# Patient Record
Sex: Male | Born: 1960 | Race: White | Hispanic: No | Marital: Married | Smoking: Never smoker
Health system: Southern US, Community
[De-identification: ages and names within clinical notes are randomized; demographics above are authoritative.]

## PROBLEM LIST (undated history)

## (undated) DIAGNOSIS — E039 Hypothyroidism, unspecified: Secondary | ICD-10-CM

## (undated) DIAGNOSIS — K649 Unspecified hemorrhoids: Secondary | ICD-10-CM

## (undated) DIAGNOSIS — L509 Urticaria, unspecified: Secondary | ICD-10-CM

## (undated) DIAGNOSIS — K219 Gastro-esophageal reflux disease without esophagitis: Secondary | ICD-10-CM

## (undated) DIAGNOSIS — H9190 Unspecified hearing loss, unspecified ear: Secondary | ICD-10-CM

## (undated) DIAGNOSIS — T7840XA Allergy, unspecified, initial encounter: Secondary | ICD-10-CM

## (undated) DIAGNOSIS — D510 Vitamin B12 deficiency anemia due to intrinsic factor deficiency: Secondary | ICD-10-CM

## (undated) DIAGNOSIS — G5621 Lesion of ulnar nerve, right upper limb: Secondary | ICD-10-CM

## (undated) DIAGNOSIS — D696 Thrombocytopenia, unspecified: Secondary | ICD-10-CM

## (undated) DIAGNOSIS — M1990 Unspecified osteoarthritis, unspecified site: Secondary | ICD-10-CM

## (undated) DIAGNOSIS — B019 Varicella without complication: Secondary | ICD-10-CM

## (undated) HISTORY — DX: Thrombocytopenia, unspecified: D69.6

## (undated) HISTORY — PX: OTHER SURGICAL HISTORY: SHX169

## (undated) HISTORY — DX: Allergy, unspecified, initial encounter: T78.40XA

## (undated) HISTORY — DX: Gilbert syndrome: E80.4

## (undated) HISTORY — DX: Hypothyroidism, unspecified: E03.9

## (undated) HISTORY — DX: Unspecified hearing loss, unspecified ear: H91.90

## (undated) HISTORY — DX: Varicella without complication: B01.9

## (undated) HISTORY — DX: Unspecified hemorrhoids: K64.9

## (undated) HISTORY — PX: SINOSCOPY: SHX187

## (undated) HISTORY — DX: Urticaria, unspecified: L50.9

## (undated) HISTORY — DX: Lesion of ulnar nerve, right upper limb: G56.21

## (undated) HISTORY — DX: Gastro-esophageal reflux disease without esophagitis: K21.9

## (undated) HISTORY — DX: Vitamin B12 deficiency anemia due to intrinsic factor deficiency: D51.0

## (undated) HISTORY — DX: Unspecified osteoarthritis, unspecified site: M19.90

## (undated) HISTORY — PX: SEPTOPLASTY: SUR1290

---
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
## 2005-02-28 ENCOUNTER — Encounter: Admission: RE | Admit: 2005-02-28 | Discharge: 2005-02-28 | Payer: Self-pay | Admitting: *Deleted

## 2005-03-31 ENCOUNTER — Encounter: Admission: RE | Admit: 2005-03-31 | Discharge: 2005-03-31 | Payer: Self-pay | Admitting: *Deleted

## 2005-09-21 IMAGING — US US SCROTUM
1 series · 14 of 25 positions shown · non-contrast
Comparison: none

CLINICAL DATA: Palpable left scrotal mass on physical exam with superior left probable testicular appendage with loculated hydrocele and varicocele for follow up from previous scrotal ultrasound 02/28/05.  
SCROTAL ULTRASOUND: 
Comparison is made with previous scrotal ultrasound GI [REDACTED] of 02/28/05.  The bilateral testes are sonographically normal with the right measuring 4.6cm long x 2.5cm AP x 2.4cm wide and the left 4.3cm long x 2.1cm AP x 3.1cm wide with symmetrical normal testicular blood flow.  Stable findings of left varicocele are seen.  Also, partially loculated small left hydrocele is unchanged.  At the superior left scrotum outlined by loculated hydrocele is a stable 5 x 2 x 3mm soft tissue nodule with no associated abnormal vascularity--consistent with anatomic variant appendix testis.  A small 5mm spermatocele is seen at the right epididymal head with the bilateral epididymal heads remaining sonographically normal.

[Series 1: unknown · 0.09mm/px · 14 of 49 slices shown]
[im 1/49]
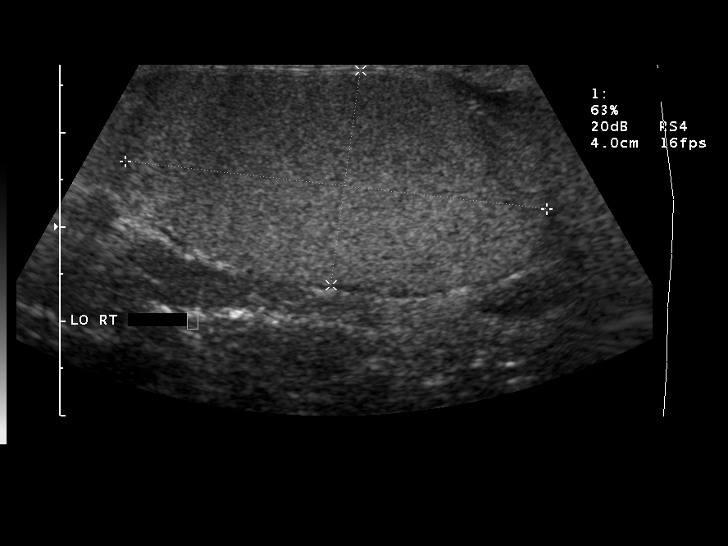
[im 5/49]
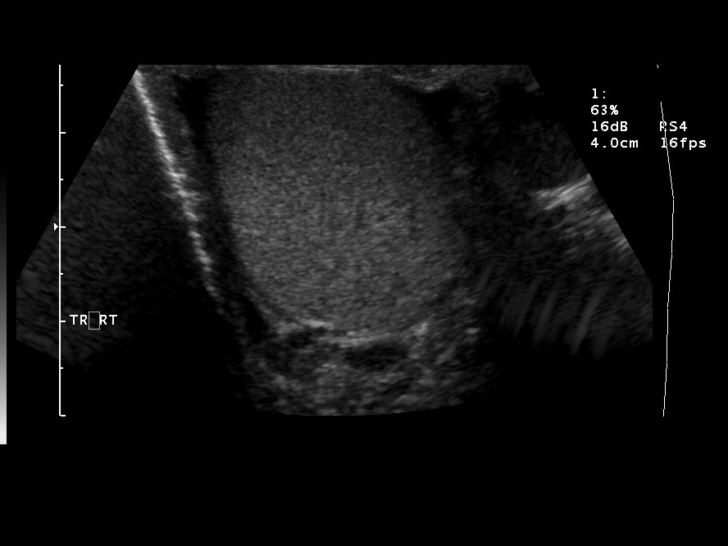
[im 9/49]
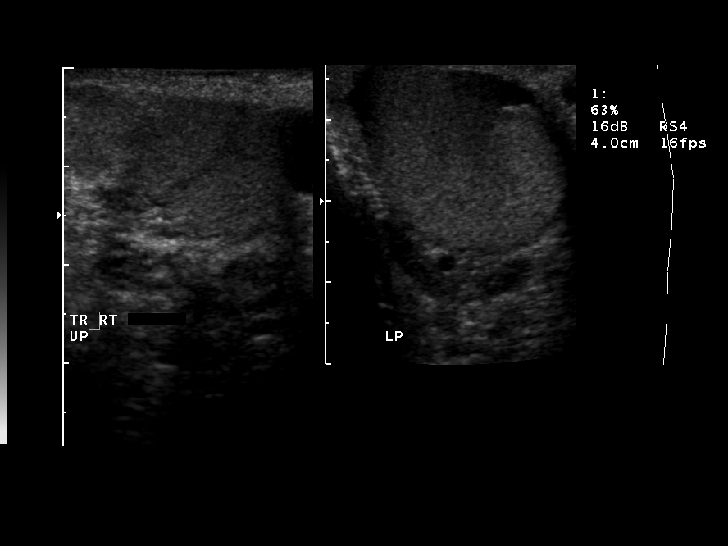
[im 13/49]
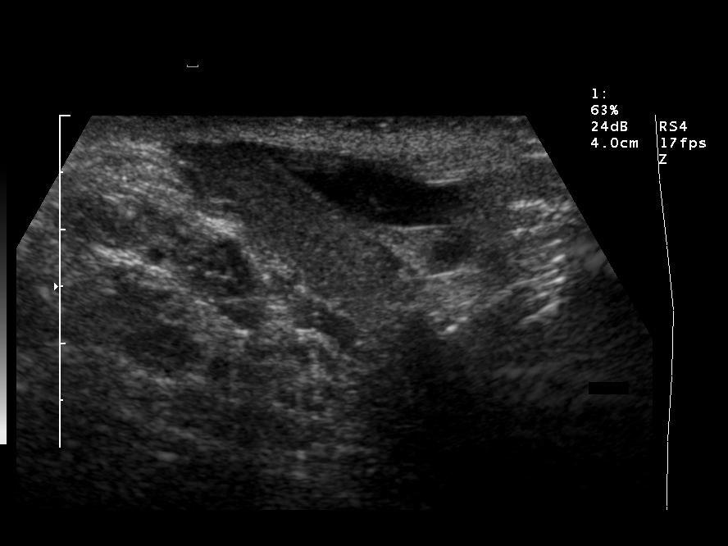
[im 17/49]
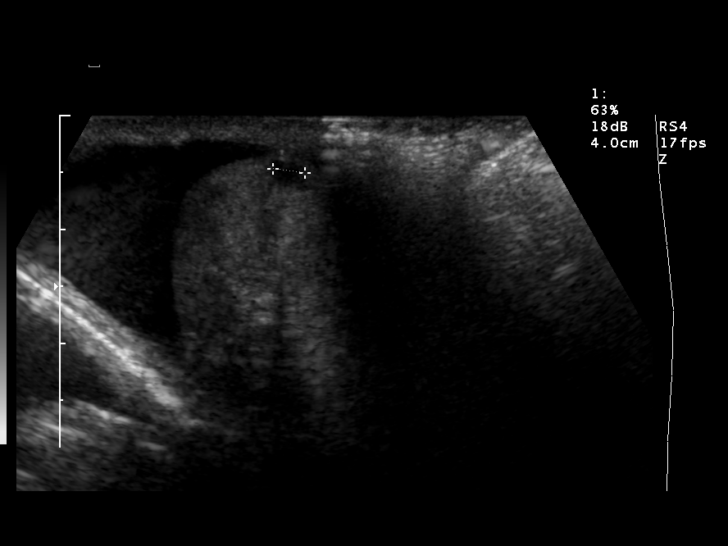
[im 19/49]
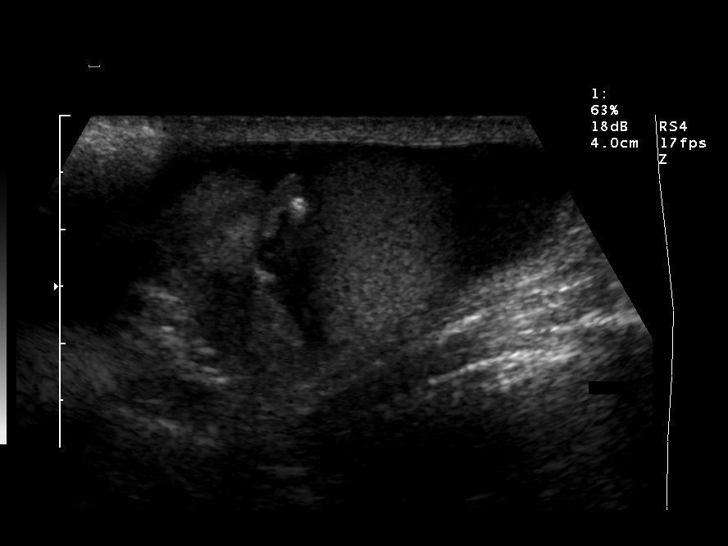
[im 23/49]
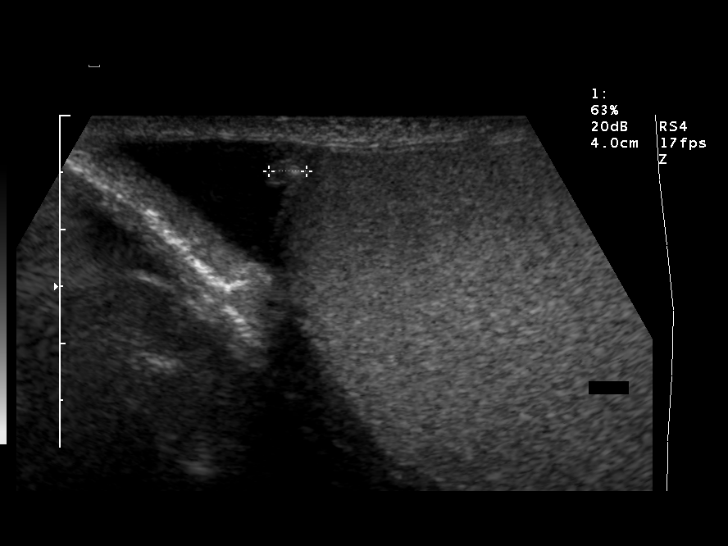
[im 27/49]
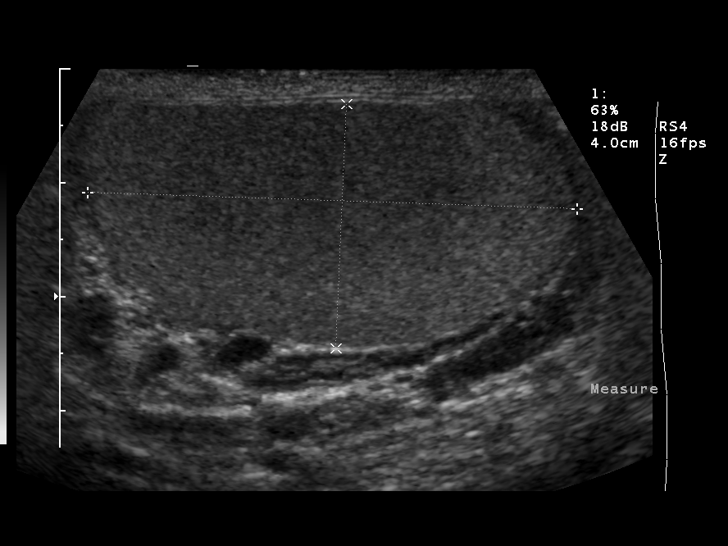
[im 31/49]
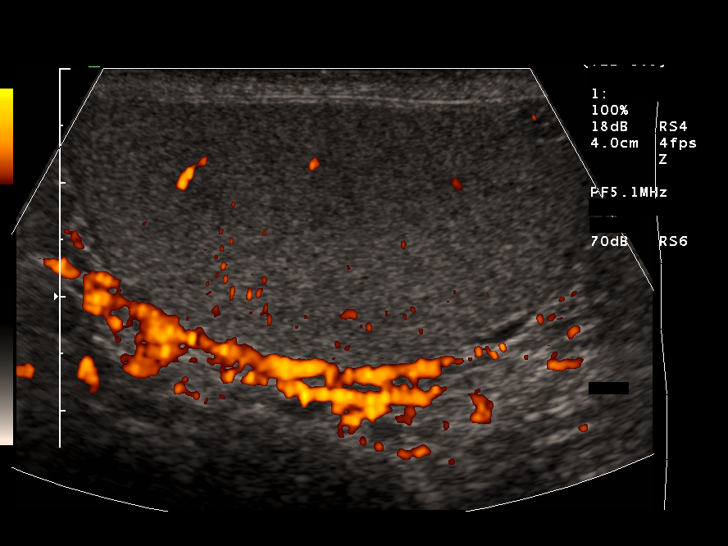
[im 33/49]
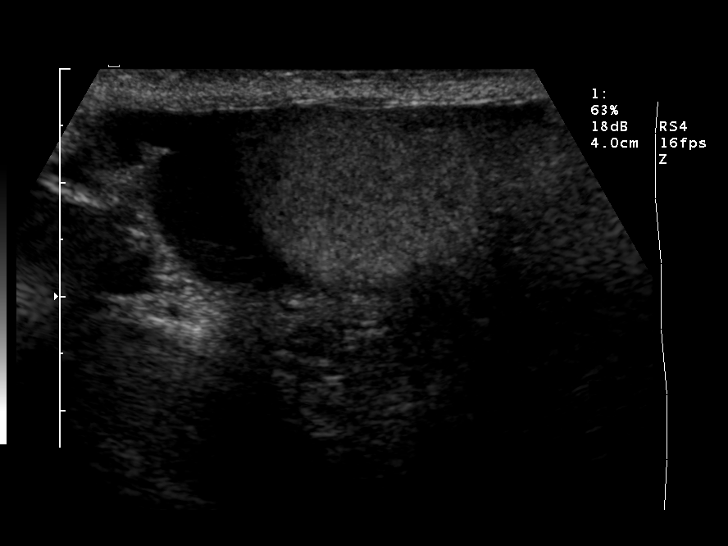
[im 37/49]
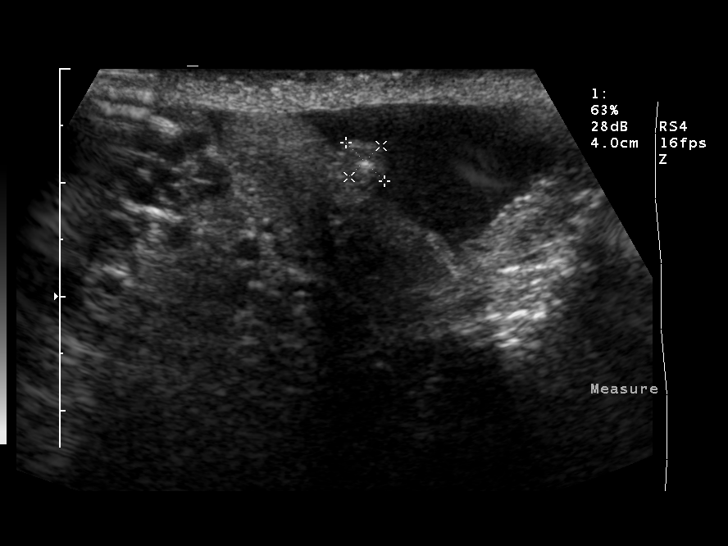
[im 41/49]
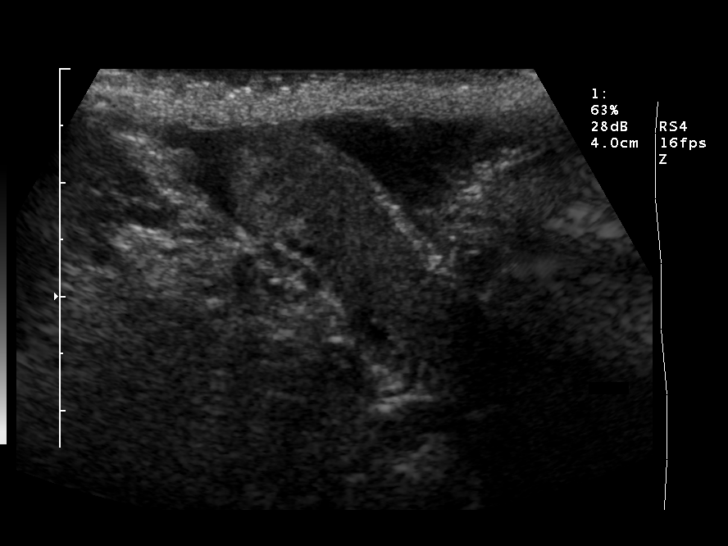
[im 45/49]
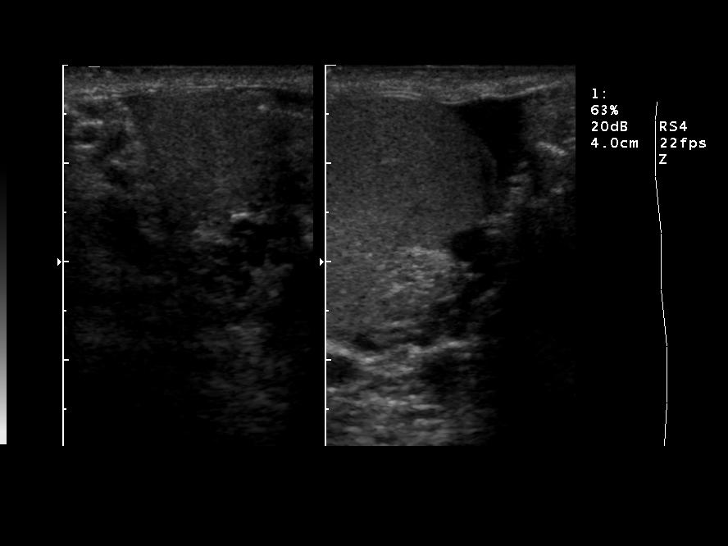
[im 49/49]
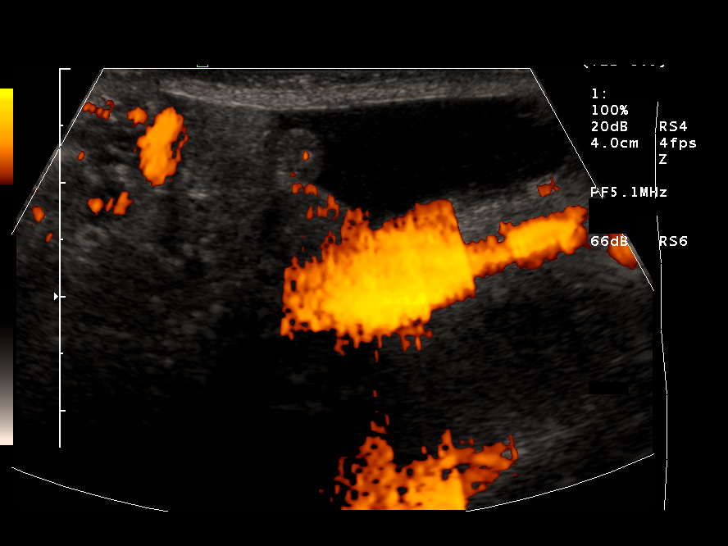

[14 of 25 positions shown; findings below may reference images not displayed]

IMPRESSION: Since previous GI [REDACTED] scrotal ultrasound 02/28/05 no interval change: 
1.  Stable slight loculated superior left hydrocele and probable anatomic left appendix testis. 
2.  Left varicocele. 
3.  Otherwise normal.

## 2008-12-07 HISTORY — PX: COLONOSCOPY: SHX174

## 2008-12-18 LAB — HM COLONOSCOPY

## 2010-01-07 ENCOUNTER — Encounter: Admission: RE | Admit: 2010-01-07 | Discharge: 2010-01-07 | Payer: Self-pay | Admitting: Family Medicine

## 2010-10-09 HISTORY — PX: SHOULDER SURGERY: SHX246

## 2010-12-20 ENCOUNTER — Other Ambulatory Visit: Payer: Self-pay | Admitting: Emergency Medicine

## 2010-12-20 ENCOUNTER — Ambulatory Visit
Admission: RE | Admit: 2010-12-20 | Discharge: 2010-12-20 | Disposition: A | Discharge status: Home / Self Care | Payer: Self-pay | Source: Ambulatory Visit | Attending: Emergency Medicine | Admitting: Emergency Medicine

## 2010-12-20 ENCOUNTER — Ambulatory Visit (INDEPENDENT_AMBULATORY_CARE_PROVIDER_SITE_OTHER): Payer: Self-pay | Admitting: Emergency Medicine

## 2010-12-20 ENCOUNTER — Encounter: Payer: Self-pay | Admitting: Emergency Medicine

## 2010-12-20 DIAGNOSIS — R52 Pain, unspecified: Secondary | ICD-10-CM

## 2010-12-20 DIAGNOSIS — M79609 Pain in unspecified limb: Secondary | ICD-10-CM | POA: Insufficient documentation

## 2010-12-20 DIAGNOSIS — E039 Hypothyroidism, unspecified: Secondary | ICD-10-CM | POA: Insufficient documentation

## 2010-12-23 ENCOUNTER — Telehealth (INDEPENDENT_AMBULATORY_CARE_PROVIDER_SITE_OTHER): Payer: Self-pay | Admitting: *Deleted

## 2010-12-27 NOTE — Assessment & Plan Note (Signed)
Summary: SWELLING TOP FOOT/TJ rm 5   Vital Signs:  Patient Profile:   50 Years Old Male CC:      LT foot pain Weight:      210.75 pounds O2 Sat:      97 % O2 treatment:    Room Air Temp:     97.9 degrees F oral Pulse rate:   70 / minute Resp:     16 per minute BP sitting:   125 / 81  (left arm) Cuff size:   large  Vitals Entered By: Clemens Catholic LPN (December 20, 2010 6:09 PM)                  Updated Prior Medication List: SYNTHROID 125 MCG TABS (LEVOTHYROXINE SODIUM)  B COMPLEX-B12  TABS (B COMPLEX VITAMINS)  FOLIC ACID 1 MG TABS (FOLIC ACID)   Current Allergies: ! PENICILLINHistory of Present Illness History from: patient Chief Complaint: LT foot pain History of Present Illness: L foot and ankle pain.  Doesn't recall any trauma or twisting ankle but has been walking more.  Also is a hiker but not too much recently. Noticed pain and swelling top or foot and ankle last few weeks.  No leg pain.  No Fever.  No immobility or reasonable causes of possible DVT.  Taking Ibuprofen daily for a recent R shoulder labral surgery. 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    REVIEW OF SYSTEMS Constitutional Symptoms      Denies fever, chills, night sweats, weight loss, weight gain, and fatigue.  Eyes       Denies change in vision, eye pain, eye discharge, glasses, contact lenses, and eye surgery. Ear/Nose/Throat/Mouth       Denies hearing loss/aids, change in hearing, ear pain, ear discharge, dizziness, frequent runny nose, frequent nose bleeds, sinus problems, sore throat, hoarseness, and tooth pain or bleeding.  Respiratory       Denies dry cough, productive cough, wheezing, shortness of breath, asthma, bronchitis, and emphysema/COPD.  Cardiovascular       Denies murmurs, chest pain, and tires easily with exhertion.    Gastrointestinal       Denies stomach pain, nausea/vomiting, diarrhea, constipation, blood in bowel movements, and  indigestion. Genitourniary       Denies painful urination, kidney stones, and loss of urinary control. Neurological       Denies paralysis, seizures, and fainting/blackouts. Musculoskeletal       Denies muscle pain, joint pain, joint stiffness, decreased range of motion, redness, swelling, muscle weakness, and gout.  Skin       Denies bruising, unusual mles/lumps or sores, and hair/skin or nail changes.  Psych       Denies mood changes, temper/anger issues, anxiety/stress, speech problems, depression, and sleep problems. Other Comments: pt states that he has had pain in his LT foot x 1wk radiating up to his ankle. he is taking IBF already due to a recent shoulder surgery.   Past History:  Past Medical History: Hypothyroidism pernicious anemia  Past Surgical History: RT shoulder surgery sinus surgery x 2  Family History: pernicious anemia  Social History: Never Smoked Alcohol use-no Drug use-no Smoking Status:  never Drug Use:  no Physical Exam General appearance: well developed, well nourished, no acute distress MSE: oriented to time, place, and person L foot/ankle: FROM, full strength, resisted motions not painful.  No TTP medial/lateral malleolus, navicular, base of 5th, calcaneus, Achilles, or proximal  fibula.  + dorsal swelling. and tenderness over tibial-talar joint and along extensor tendons.  No ecchymoses. Distal NV status intact.  No limping gait.  No calf tenderness. Assessment New Problems: FOOT PAIN (ICD-729.5) HYPOTHYROIDISM (ICD-244.9)   Plan New Orders: New Patient Level III [99203] T-DG Ankle Complete*L* [73610] T-DG Foot Complete*L* [73630] Planning Comments:   Xray of L foot & ankle are ordered.  Radiology read both as normal.  Continue ibuprofen as needed. He has 800mg  tabs at home as well as topical Voltaren.  Attempt to rest foot and ankle, ice, compression, elevation.  Will refer to Dr. Margaretha Sheffield for further sports med eval this week.  Likely  extensor tendonitis, but need to consider anterior impingement vs stress fracture.   The patient and/or caregiver has been counseled thoroughly with regard to medications prescribed including dosage, schedule, interactions, rationale for use, and possible side effects and they verbalize understanding.  Diagnoses and expected course of recovery discussed and will return if not improved as expected or if the condition worsens. Patient and/or caregiver verbalized understanding.   Orders Added: 1)  New Patient Level III [99203] 2)  T-DG Ankle Complete*L* [73610] 3)  T-DG Foot Complete*L* [91478]

## 2010-12-27 NOTE — Progress Notes (Signed)
  Phone Note Outgoing Call   Call placed by: Clemens Catholic LPN,  December 21, 2010 12:41 PM Call placed to: Patient Summary of Call: Tried to schedule appt for pt with Dr Margaretha Sheffield in [place], he is not going to be in the [place] office  this wk. pt does not want to go to the [facility] location so I tried to schedule appt with Dr Pearletha Forge. Due to pts schedule I was unable to sch appt with Dr Pearletha Forge @ a time that was good for the pt so I gave the pt Dr Lazaro Arms phone number and the pt is to call and schedule an appt when it is convient for him. Initial call taken by: Clemens Catholic LPN,  December 21, 2010 12:45 PM

## 2013-11-21 LAB — COMPREHENSIVE METABOLIC PANEL
ALBUMIN RATIO: 2.4
ALT: 19
AST: 16 U/L
Albumin: 4.5
Alkaline Phosphatase: 54 U/L
BUN/Creatinine Ratio: 14
BUN: 16
CALCIUM: 9.1 mg/dL
CO2: 26 mmol/L
Chloride: 102 mmol/L
Creatinine, Ser: 1.18
EGFR (African American): 82
GFR CALC NON AF AMER: 71
Globulin: 1.9
Glucose: 75
POTASSIUM: 4.4 mmol/L
SODIUM: 143
TOTBILIFLUID: 1.3
Total Protein: 6.4 g/dL

## 2013-11-21 LAB — PSA: PROSTATE SPECIFIC AG, SERUM: 0.5

## 2013-11-21 LAB — LIPID PANEL
Cholesterol: 148
HDL: 41
LDL: 89
TRIGLYCERIDES: 89
VLDL: 18 mg/dL

## 2013-11-28 LAB — TSH: TSH: 0.211

## 2014-02-13 LAB — TSH: TSH: 3.95

## 2014-11-13 LAB — CBC
HEMATOCRIT: 46 %
HGB: 14.9 g/dL
MCH: 28.9
MCHC: 32.4
MCV: 89.1
MPV: 9 fL (ref 0–99.8)
Platelet: 108
RBC: 5.18
RDW: 12.4
WBC: 5.3

## 2014-11-13 LAB — COMPREHENSIVE METABOLIC PANEL
ALK PHOS: 60 U/L
ALT: 25
AST: 18 U/L
BILIRUBIN: 1.5
BUN: 16
CALCIUM: 8.9 mg/dL
CO2: 29 mmol/L
CREATINE, SERUM: 1
Chloride: 105 mmol/L
GFR CALC AF AMER: 4.2
Glucose: 108
Potassium: 4.1 mmol/L
SODIUM: 144
Total Protein: 6.6 g/dL

## 2014-11-13 LAB — TSH: TSH: 0.455

## 2014-11-13 LAB — PSA: Prostate Specific Ag, Serum: 0.7

## 2014-11-20 DIAGNOSIS — F419 Anxiety disorder, unspecified: Secondary | ICD-10-CM | POA: Insufficient documentation

## 2015-03-26 ENCOUNTER — Encounter: Payer: Self-pay | Admitting: Nurse Practitioner

## 2015-03-26 ENCOUNTER — Other Ambulatory Visit (INDEPENDENT_AMBULATORY_CARE_PROVIDER_SITE_OTHER): Payer: BC Managed Care – PPO

## 2015-03-26 ENCOUNTER — Ambulatory Visit (INDEPENDENT_AMBULATORY_CARE_PROVIDER_SITE_OTHER): Payer: BC Managed Care – PPO | Admitting: Nurse Practitioner

## 2015-03-26 VITALS — BP 135/85 | HR 59 | Temp 98.4°F | Resp 16 | Ht 73.0 in | Wt 209.0 lb

## 2015-03-26 DIAGNOSIS — D510 Vitamin B12 deficiency anemia due to intrinsic factor deficiency: Secondary | ICD-10-CM | POA: Diagnosis not present

## 2015-03-26 DIAGNOSIS — E039 Hypothyroidism, unspecified: Secondary | ICD-10-CM

## 2015-03-26 DIAGNOSIS — L570 Actinic keratosis: Secondary | ICD-10-CM | POA: Insufficient documentation

## 2015-03-26 DIAGNOSIS — Z0000 Encounter for general adult medical examination without abnormal findings: Secondary | ICD-10-CM

## 2015-03-26 DIAGNOSIS — Z23 Encounter for immunization: Secondary | ICD-10-CM

## 2015-03-26 DIAGNOSIS — Z114 Encounter for screening for human immunodeficiency virus [HIV]: Secondary | ICD-10-CM

## 2015-03-26 DIAGNOSIS — L989 Disorder of the skin and subcutaneous tissue, unspecified: Secondary | ICD-10-CM

## 2015-03-26 LAB — LIPID PANEL
Cholesterol: 150 mg/dL (ref 0–200)
HDL: 40.3 mg/dL (ref >39.00)
LDL Cholesterol: 94 mg/dL (ref 0–99)
NONHDL: 109.7
Total CHOL/HDL Ratio: 4
Triglycerides: 79 mg/dL (ref 0.0–149.0)
VLDL: 15.8 mg/dL (ref 0.0–40.0)

## 2015-03-26 LAB — CBC WITH DIFFERENTIAL/PLATELET
BASOS ABS: 0 10*3/uL (ref 0.0–0.1)
Basophils Relative: 0.3 % (ref 0.0–3.0)
Eosinophils Absolute: 0.1 10*3/uL (ref 0.0–0.7)
Eosinophils Relative: 2.3 % (ref 0.0–5.0)
HCT: 46.2 % (ref 39.0–52.0)
Hemoglobin: 15.6 g/dL (ref 13.0–17.0)
LYMPHS PCT: 32.4 % (ref 12.0–46.0)
Lymphs Abs: 1.5 10*3/uL (ref 0.7–4.0)
MCHC: 33.8 g/dL (ref 30.0–36.0)
MCV: 89.1 fl (ref 78.0–100.0)
MONOS PCT: 9.2 % (ref 3.0–12.0)
Monocytes Absolute: 0.4 10*3/uL (ref 0.1–1.0)
NEUTROS PCT: 55.8 % (ref 43.0–77.0)
Neutro Abs: 2.5 10*3/uL (ref 1.4–7.7)
PLATELETS: 134 10*3/uL — AB (ref 150.0–400.0)
RBC: 5.19 Mil/uL (ref 4.22–5.81)
RDW: 13.4 % (ref 11.5–15.5)
WBC: 4.5 10*3/uL (ref 4.0–10.5)

## 2015-03-26 LAB — COMPREHENSIVE METABOLIC PANEL
ALK PHOS: 51 U/L (ref 39–117)
ALT: 17 U/L (ref 0–53)
AST: 17 U/L (ref 0–37)
Albumin: 4.6 g/dL (ref 3.5–5.2)
BILIRUBIN TOTAL: 1.7 mg/dL — AB (ref 0.2–1.2)
BUN: 12 mg/dL (ref 6–23)
CO2: 27 meq/L (ref 19–32)
CREATININE: 0.99 mg/dL (ref 0.40–1.50)
Calcium: 9.2 mg/dL (ref 8.4–10.5)
Chloride: 102 mEq/L (ref 96–112)
GFR: 84.02 mL/min (ref >60.00)
Glucose, Bld: 101 mg/dL — ABNORMAL HIGH (ref 70–99)
Potassium: 4 mEq/L (ref 3.5–5.1)
SODIUM: 137 meq/L (ref 135–145)
TOTAL PROTEIN: 6.9 g/dL (ref 6.0–8.3)

## 2015-03-26 LAB — HEPATITIS B SURFACE ANTIBODY, QUANTITATIVE: Hepatitis B-Post: 11.1 m[IU]/mL

## 2015-03-26 LAB — HEMOGLOBIN A1C: HEMOGLOBIN A1C: 5.5 % (ref 4.6–6.5)

## 2015-03-26 LAB — HOMOCYSTEINE: HOMOCYSTEINE: 10.2 umol/L (ref 4.0–15.4)

## 2015-03-26 LAB — URINALYSIS, MICROSCOPIC ONLY

## 2015-03-26 LAB — VITAMIN B12: Vitamin B-12: 541 pg/mL (ref 211–911)

## 2015-03-26 LAB — FOLATE

## 2015-03-26 LAB — VITAMIN D 25 HYDROXY (VIT D DEFICIENCY, FRACTURES): VITD: 19.9 ng/mL — AB (ref 30.00–100.00)

## 2015-03-26 LAB — TSH: TSH: 1.3 u[IU]/mL (ref 0.35–4.50)

## 2015-03-26 NOTE — Progress Notes (Signed)
Subjective:     Eric Vazquez is a 54 y.o. male and is here for a comprehensive physical exam. The patient reports but is currently treated for pernicious anemia & thyroid disease. 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 He is from [place], lived in [place] before relocating to [place]. Historical provider [facility]. This location is more convenient. Impressive Hx of pernicious anemia: pt reports he became nearly non-functional (extreme fatigue, paresthesias in extremities, slurred speech)-had to sell dentist practice. Finally a neurologist diagnosed B12 deficiency & cause was determined to be pernicious anemia. He has fam Hx: mother & MGF had pernicious anemia.  History   Social History  . Marital Status: Married    Spouse Name: N/A  . Number of Children: 4  . Years of Education: N/A   Occupational History  . dentist    Social History Main Topics  . Smoking status: Never Smoker   . Smokeless tobacco: Never Used  . Alcohol Use: Yes     Comment: 1 weekly  . Drug Use: No  . Sexual Activity: Yes   Other Topics Concern  . 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 He lives with his wife & 3 sons, 1 son is grown. He is from [place], lived in [place] before relocating to [place].   Health Maintenance  Topic Date Due  . INFLUENZA VACCINE  05/10/2015  . COLONOSCOPY  03/26/2019  . TETANUS/TDAP  03/25/2025  . HIV Screening  Completed    The following portions of the patient's history were reviewed and updated as appropriate: allergies, current medications, past family history, past medical history, past social history, past surgical history and problem list.  Review of Systems Constitutional: negative for fatigue and fevers Eyes: positive for contacts/glasses and eye exm within last 1 yr, negative for irritation and redness Ears, nose, mouth, throat, and face: negative for  nasal congestion and sore throat Respiratory: negative for cough Cardiovascular: negative for chest pain, chest pressure/discomfort, irregular heart beat and lower extremity edema Gastrointestinal: negative for abdominal pain, constipation, diarrhea and dyspepsia, Hx of diarrhea before pernisous anemia was treated. He has had 3 colonoscopies-no polyps or inflammatory disease, per pt. Integument/breast: positive for recurrent skin lesion R cheek, saw derm less than 1 yr ago, will f/u this yr. Musculoskeletal:negative for arthralgias and myalgias Neurological: negative for headaches Behavioral/Psych: negative for excessive alcohol consumption, illegal drug usage, sleep disturbance and tobacco use Endocrine: recent dose decrease on levothyroxine due to feeling jittery & rapid HR. Symptoms resolved w/decreased dose.   Objective:    BP 135/85 mmHg  Pulse 59  Temp(Src) 98.4 F (36.9 C) (Temporal)  Resp 16  Ht 6\' 1"  (1.854 m)  Wt 209 lb (94.802 kg)  BMI 27.58 kg/m2  SpO2 99% General appearance: alert, cooperative, appears stated age and no distress Head: Normocephalic, without obvious abnormality, atraumatic Eyes: negative findings: lids and lashes normal, conjunctivae and sclerae normal, corneas clear and pupils equal, round, reactive to light and accomodation Ears: nml canals, air & fluid bilat, bones visible Throat: lips, mucosa, and tongue normal; teeth and gums normal Neck: no adenopathy, no carotid bruit, supple, symmetrical, trachea midline and thyroid not enlarged, symmetric, no tenderness/mass/nodules Lungs: clear to auscultation bilaterally Heart: regular rate and rhythm, S1, S2 normal, no murmur, click, rub or gallop Abdomen: soft, non-tender; bowel sounds  normal; no masses,  no organomegaly Extremities: extremities normal, atraumatic, no cyanosis or edema Pulses: 2+ and symmetric Skin: R cheek lesion 2 mm raised, flesh toned. no scale or telangiectasia Neurologic: Grossly normal     Assessment:Plan     1. Preventative health care Exercise & nutrition counseling - CBC with Differential/Platelet - Comprehensive metabolic panel - Hemoglobin A1c - HIV antibody - Fecal occult blood, imunochemical; Future - Vit D  25 hydroxy (rtn osteoporosis monitoring) - Lipid panel - Urine Microscopic - Hepatitis B surface antibody - TB Skin Test-wife will read, pt will call w/results & her credentials.  2. Screening for HIV (human immunodeficiency virus) - HIV antibody  3. Pernicious anemia No current symptoms Continue B12 inj biweekly - Antinuclear Antibodies, IFA - CBC with Differential/Platelet - Vitamin B12 - Methylmalonic acid, serum - Vitamin B1 -folic acid -homocysteine  4. Hypothyroidism, unspecified hypothyroidism type Recent dose change due to "jittery". Symptoms resolved. - TSH  5. Immunization due - Tdap vaccine greater than or equal to 7yo IM  6. Skin lesion of face F/u w/derm  F/u3 mos-pernicious anemia, thyroid, PRN sooner labs

## 2015-03-26 NOTE — Patient Instructions (Signed)
My office will call with lab results and follow up. Please call us with PPD results & credentials of nurse reading test. Test must be read between 9 am Sunday -9am Monday.  Develop lifelong habits of exercise most days of the week: take a 30 minute walk. The benefits include weight loss, lower risk for heart disease, diabetes, stroke, high blood pressure, lower rates of depression & dementia, better sleep quality & bone health.  For best health: Cut out refined sugar: anything that is sweet when you eat or drink it, except fresh fruit. Cut out refined grains: white bread, rolls, biscuits, bagels, muffins, pasta and cereals. Choose grains with 4 gm or more of fiber per serving.  Consider reading Eat to Live by Excell Seltzer and begin implementing principles.  Pleasure to meet you!  Preventive Care for Adults A healthy lifestyle and preventive care can promote health and wellness. Preventive health guidelines for men include the following key practices:  A routine yearly physical is a good way to check with your health care provider about your health and preventative screening. It is a chance to share any concerns and updates on your health and to receive a thorough exam.  Visit your dentist for a routine exam and preventative care every 6 months. Brush your teeth twice a day and floss once a day. Good oral hygiene prevents tooth decay and gum disease.  The frequency of eye exams is based on your age, health, family medical history, use of contact lenses, and other factors. Follow your health care provider's recommendations for frequency of eye exams.  Eat a healthy diet. Foods such as vegetables, fruits, whole grains, low-fat dairy products, and lean protein foods contain the nutrients you need without too many calories. Decrease your intake of foods high in solid fats, added sugars, and salt. Eat the right amount of calories for you.Get information about a proper diet from your health care  provider, if necessary.  Regular physical exercise is one of the most important things you can do for your health. Most adults should get at least 150 minutes of moderate-intensity exercise (any activity that increases your heart rate and causes you to sweat) each week. In addition, most adults need muscle-strengthening exercises on 2 or more days a week.  Maintain a healthy weight. The body mass index (BMI) is a screening tool to identify possible weight problems. It provides an estimate of body fat based on height and weight. Your health care provider can find your BMI and can help you achieve or maintain a healthy weight.For adults 20 years and older:  A BMI below 18.5 is considered underweight.  A BMI of 18.5 to 24.9 is normal.  A BMI of 25 to 29.9 is considered overweight.  A BMI of 30 and above is considered obese.  Maintain normal blood lipids and cholesterol levels by exercising and minimizing your intake of saturated fat. Eat a balanced diet with plenty of fruit and vegetables. Blood tests for lipids and cholesterol should begin at age 76 and be repeated every 5 years. If your lipid or cholesterol levels are high, you are over 50, or you are at high risk for heart disease, you may need your cholesterol levels checked more frequently.Ongoing high lipid and cholesterol levels should be treated with medicines if diet and exercise are not working.  If you smoke, find out from your health care provider how to quit. If you do not use tobacco, do not start.  Lung cancer screening is  recommended for adults aged 57-80 years who are at high risk for developing lung cancer because of a history of smoking. A yearly low-dose CT scan of the lungs is recommended for people who have at least a 30-pack-year history of smoking and are a current smoker or have quit within the past 15 years. A pack year of smoking is smoking an average of 1 pack of cigarettes a day for 1 year (for example: 1 pack a day for  30 years or 2 packs a day for 15 years). Yearly screening should continue until the smoker has stopped smoking for at least 15 years. Yearly screening should be stopped for people who develop a health problem that would prevent them from having lung cancer treatment.  If you choose to drink alcohol, do not have more than 2 drinks per day. One drink is considered to be 12 ounces (355 mL) of beer, 5 ounces (148 mL) of wine, or 1.5 ounces (44 mL) of liquor.  Avoid use of street drugs. Do not share needles with anyone. Ask for help if you need support or instructions about stopping the use of drugs.  High blood pressure causes heart disease and increases the risk of stroke. Your blood pressure should be checked at least every 1-2 years. Ongoing high blood pressure should be treated with medicines, if weight loss and exercise are not effective.  If you are 77-77 years old, ask your health care provider if you should take aspirin to prevent heart disease.  Diabetes screening involves taking a blood sample to check your fasting blood sugar level. This should be done once every 3 years, after age 62, if you are within normal weight and without risk factors for diabetes. Testing should be considered at a younger age or be carried out more frequently if you are overweight and have at least 1 risk factor for diabetes.  Colorectal cancer can be detected and often prevented. Most routine colorectal cancer screening begins at the age of 60 and continues through age 5. However, your health care provider may recommend screening at an earlier age if you have risk factors for colon cancer. On a yearly basis, your health care provider may provide home test kits to check for hidden blood in the stool. Use of a small camera at the end of a tube to directly examine the colon (sigmoidoscopy or colonoscopy) can detect the earliest forms of colorectal cancer. Talk to your health care provider about this at age 66, when routine  screening begins. Direct exam of the colon should be repeated every 5-10 years through age 27, unless early forms of precancerous polyps or small growths are found.  People who are at an increased risk for hepatitis B should be screened for this virus. You are considered at high risk for hepatitis B if:  You were born in a country where hepatitis B occurs often. Talk with your health care provider about which countries are considered high risk.  Your parents were born in a high-risk country and you have not received a shot to protect against hepatitis B (hepatitis B vaccine).  You have HIV or AIDS.  You use needles to inject street drugs.  You live with, or have sex with, someone who has hepatitis B.  You are a man who has sex with other men (MSM).  You get hemodialysis treatment.  You take certain medicines for conditions such as cancer, organ transplantation, and autoimmune conditions.  Hepatitis C blood testing is recommended for  all people born from [birth year] through 1965 and any individual with known risks for hepatitis C.  Practice safe sex. Use condoms and avoid high-risk sexual practices to reduce the spread of sexually transmitted infections (STIs). STIs include gonorrhea, chlamydia, syphilis, trichomonas, herpes, HPV, and human immunodeficiency virus (HIV). Herpes, HIV, and HPV are viral illnesses that have no cure. They can result in disability, cancer, and death.  If you are at risk of being infected with HIV, it is recommended that you take a prescription medicine daily to prevent HIV infection. This is called preexposure prophylaxis (PrEP). You are considered at risk if:  You are a man who has sex with other men (MSM) and have other risk factors.  You are a heterosexual man, are sexually active, and are at increased risk for HIV infection.  You take drugs by injection.  You are sexually active with a partner who has HIV.  Talk with your health care provider about whether  you are at high risk of being infected with HIV. If you choose to begin PrEP, you should first be tested for HIV. You should then be tested every 3 months for as long as you are taking PrEP.  A one-time screening for abdominal aortic aneurysm (AAA) and surgical repair of large AAAs by ultrasound are recommended for men ages 54 to 59 years who are current or former smokers.  Healthy men should no longer receive prostate-specific antigen (PSA) blood tests as part of routine cancer screening. Talk with your health care provider about prostate cancer screening.  Testicular cancer screening is not recommended for adult males who have no symptoms. Screening includes self-exam, a health care provider exam, and other screening tests. Consult with your health care provider about any symptoms you have or any concerns you have about testicular cancer.  Use sunscreen. Apply sunscreen liberally and repeatedly throughout the day. You should seek shade when your shadow is shorter than you. Protect yourself by wearing long sleeves, pants, a wide-brimmed hat, and sunglasses year round, whenever you are outdoors.  Once a month, do a whole-body skin exam, using a mirror to look at the skin on your back. Tell your health care provider about new moles, moles that have irregular borders, moles that are larger than a pencil eraser, or moles that have changed in shape or color.  Stay current with required vaccines (immunizations).  Influenza vaccine. All adults should be immunized every year.  Tetanus, diphtheria, and acellular pertussis (Td, Tdap) vaccine. An adult who has not previously received Tdap or who does not know his vaccine status should receive 1 dose of Tdap. This initial dose should be followed by tetanus and diphtheria toxoids (Td) booster doses every 10 years. Adults with an unknown or incomplete history of completing a 3-dose immunization series with Td-containing vaccines should begin or complete a primary  immunization series including a Tdap dose. Adults should receive a Td booster every 10 years.  Varicella vaccine. An adult without evidence of immunity to varicella should receive 2 doses or a second dose if he has previously received 1 dose.  Human papillomavirus (HPV) vaccine. Males aged 37-21 years who have not received the vaccine previously should receive the 3-dose series. Males aged 22-26 years may be immunized. Immunization is recommended through the age of 72 years for any male who has sex with males and did not get any or all doses earlier. Immunization is recommended for any person with an immunocompromised condition through the age of 22 years if  he did not get any or all doses earlier. During the 3-dose series, the second dose should be obtained 4-8 weeks after the first dose. The third dose should be obtained 24 weeks after the first dose and 16 weeks after the second dose.  Zoster vaccine. One dose is recommended for adults aged 75 years or older unless certain conditions are present.  Measles, mumps, and rubella (MMR) vaccine. Adults born before [birth year] generally are considered immune to measles and mumps. Adults born in [birth year] or later should have 1 or more doses of MMR vaccine unless there is a contraindication to the vaccine or there is laboratory evidence of immunity to each of the three diseases. A routine second dose of MMR vaccine should be obtained at least 28 days after the first dose for students attending postsecondary schools, health care workers, or international travelers. People who received inactivated measles vaccine or an unknown type of measles vaccine during 1963-1967 should receive 2 doses of MMR vaccine. People who received inactivated mumps vaccine or an unknown type of mumps vaccine before 1979 and are at high risk for mumps infection should consider immunization with 2 doses of MMR vaccine. Unvaccinated health care workers born before [birth year] who lack laboratory evidence of  measles, mumps, or rubella immunity or laboratory confirmation of disease should consider measles and mumps immunization with 2 doses of MMR vaccine or rubella immunization with 1 dose of MMR vaccine.  Pneumococcal 13-valent conjugate (PCV13) vaccine. When indicated, a person who is uncertain of his immunization history and has no record of immunization should receive the PCV13 vaccine. An adult aged 35 years or older who has certain medical conditions and has not been previously immunized should receive 1 dose of PCV13 vaccine. This PCV13 should be followed with a dose of pneumococcal polysaccharide (PPSV23) vaccine. The PPSV23 vaccine dose should be obtained at least 8 weeks after the dose of PCV13 vaccine. An adult aged 67 years or older who has certain medical conditions and previously received 1 or more doses of PPSV23 vaccine should receive 1 dose of PCV13. The PCV13 vaccine dose should be obtained 1 or more years after the last PPSV23 vaccine dose.  Pneumococcal polysaccharide (PPSV23) vaccine. When PCV13 is also indicated, PCV13 should be obtained first. All adults aged 33 years and older should be immunized. An adult younger than age 51 years who has certain medical conditions should be immunized. Any person who resides in a nursing home or long-term care facility should be immunized. An adult smoker should be immunized. People with an immunocompromised condition and certain other conditions should receive both PCV13 and PPSV23 vaccines. People with human immunodeficiency virus (HIV) infection should be immunized as soon as possible after diagnosis. Immunization during chemotherapy or radiation therapy should be avoided. Routine use of PPSV23 vaccine is not recommended for American Indians, [place]atives, or people younger than 65 years unless there are medical conditions that require PPSV23 vaccine. When indicated, people who have unknown immunization and have no record of immunization should receive  PPSV23 vaccine. One-time revaccination 5 years after the first dose of PPSV23 is recommended for people aged 19-64 years who have chronic kidney failure, nephrotic syndrome, asplenia, or immunocompromised conditions. People who received 1-2 doses of PPSV23 before age 40 years should receive another dose of PPSV23 vaccine at age 48 years or later if at least 5 years have passed since the previous dose. Doses of PPSV23 are not needed for people immunized with PPSV23 at or after age 15  years.  Meningococcal vaccine. Adults with asplenia or persistent complement component deficiencies should receive 2 doses of quadrivalent meningococcal conjugate (MenACWY-D) vaccine. The doses should be obtained at least 2 months apart. Microbiologists working with certain meningococcal bacteria, [place] recruits, people at risk during an outbreak, and people who travel to or live in countries with a high rate of meningitis should be immunized. A first-year college student up through age 101 years who is living in a residence hall should receive a dose if he did not receive a dose on or after his 16th birthday. Adults who have certain high-risk conditions should receive one or more doses of vaccine.  Hepatitis A vaccine. Adults who wish to be protected from this disease, have certain high-risk conditions, work with hepatitis A-infected animals, work in hepatitis A research labs, or travel to or work in countries with a high rate of hepatitis A should be immunized. Adults who were previously unvaccinated and who anticipate close contact with an international adoptee during the first 60 days after arrival in the [place] from a country with a high rate of hepatitis A should be immunized.  Hepatitis B vaccine. Adults should be immunized if they wish to be protected from this disease, have certain high-risk conditions, may be exposed to blood or other infectious body fluids, are household contacts or sex partners of hepatitis B  positive people, are clients or workers in certain care facilities, or travel to or work in countries with a high rate of hepatitis B.  Haemophilus influenzae type b (Hib) vaccine. A previously unvaccinated person with asplenia or sickle cell disease or having a scheduled splenectomy should receive 1 dose of Hib vaccine. Regardless of previous immunization, a recipient of a hematopoietic stem cell transplant should receive a 3-dose series 6-12 months after his successful transplant. Hib vaccine is not recommended for adults with HIV infection. Preventive Service / Frequency Ages 67 to 12  Blood pressure check.** / Every 1 to 2 years.  Lipid and cholesterol check.** / Every 5 years beginning at age 79.  Hepatitis C blood test.** / For any individual with known risks for hepatitis C.  Skin self-exam. / Monthly.  Influenza vaccine. / Every year.  Tetanus, diphtheria, and acellular pertussis (Tdap, Td) vaccine.** / Consult your health care provider. 1 dose of Td every 10 years.  Varicella vaccine.** / Consult your health care provider.  HPV vaccine. / 3 doses over 6 months, if 21 or younger.  Measles, mumps, rubella (MMR) vaccine.** / You need at least 1 dose of MMR if you were born in 1957 or later. You may also need a second dose.  Pneumococcal 13-valent conjugate (PCV13) vaccine.** / Consult your health care provider.  Pneumococcal polysaccharide (PPSV23) vaccine.** / 1 to 2 doses if you smoke cigarettes or if you have certain conditions.  Meningococcal vaccine.** / 1 dose if you are age 56 to 53 years and a Market researcher living in a residence hall, or have one of several medical conditions. You may also need additional booster doses.  Hepatitis A vaccine.** / Consult your health care provider.  Hepatitis B vaccine.** / Consult your health care provider.  Haemophilus influenzae type b (Hib) vaccine.** / Consult your health care provider. Ages 37 to 35  Blood pressure  check.** / Every 1 to 2 years.  Lipid and cholesterol check.** / Every 5 years beginning at age 11.  Lung cancer screening. / Every year if you are aged 61-80 years and have a 30-pack-year  history of smoking and currently smoke or have quit within the past 15 years. Yearly screening is stopped once you have quit smoking for at least 15 years or develop a health problem that would prevent you from having lung cancer treatment.  Fecal occult blood test (FOBT) of stool. / Every year beginning at age 71 and continuing until age 27. You may not have to do this test if you get a colonoscopy every 10 years.  Flexible sigmoidoscopy** or colonoscopy.** / Every 5 years for a flexible sigmoidoscopy or every 10 years for a colonoscopy beginning at age 68 and continuing until age 67.  Hepatitis C blood test.** / For all people born from [birth year] through 1965 and any individual with known risks for hepatitis C.  Skin self-exam. / Monthly.  Influenza vaccine. / Every year.  Tetanus, diphtheria, and acellular pertussis (Tdap/Td) vaccine.** / Consult your health care provider. 1 dose of Td every 10 years.  Varicella vaccine.** / Consult your health care provider.  Zoster vaccine.** / 1 dose for adults aged 27 years or older.  Measles, mumps, rubella (MMR) vaccine.** / You need at least 1 dose of MMR if you were born in 1956 or later. You may also need a second dose.  Pneumococcal 13-valent conjugate (PCV13) vaccine.** / Consult your health care provider.  Pneumococcal polysaccharide (PPSV23) vaccine.** / 1 to 2 doses if you smoke cigarettes or if you have certain conditions.  Meningococcal vaccine.** / Consult your health care provider.  Hepatitis A vaccine.** / Consult your health care provider.  Hepatitis B vaccine.** / Consult your health care provider.  Haemophilus influenzae type b (Hib) vaccine.** / Consult your health care provider. Ages 7 and over  Blood pressure check.** / Every 1 to 2  years.  Lipid and cholesterol check.**/ Every 5 years beginning at age 30.  Lung cancer screening. / Every year if you are aged 30-80 years and have a 30-pack-year history of smoking and currently smoke or have quit within the past 15 years. Yearly screening is stopped once you have quit smoking for at least 15 years or develop a health problem that would prevent you from having lung cancer treatment.  Fecal occult blood test (FOBT) of stool. / Every year beginning at age 44 and continuing until age 25. You may not have to do this test if you get a colonoscopy every 10 years.  Flexible sigmoidoscopy** or colonoscopy.** / Every 5 years for a flexible sigmoidoscopy or every 10 years for a colonoscopy beginning at age 82 and continuing until age 34.  Hepatitis C blood test.** / For all people born from [birth year] through 1964 and any individual with known risks for hepatitis C.  Abdominal aortic aneurysm (AAA) screening.** / A one-time screening for ages 76 to 81 years who are current or former smokers.  Skin self-exam. / Monthly.  Influenza vaccine. / Every year.  Tetanus, diphtheria, and acellular pertussis (Tdap/Td) vaccine.** / 1 dose of Td every 10 years.  Varicella vaccine.** / Consult your health care provider.  Zoster vaccine.** / 1 dose for adults aged 77 years or older.  Pneumococcal 13-valent conjugate (PCV13) vaccine.** / Consult your health care provider.  Pneumococcal polysaccharide (PPSV23) vaccine.** / 1 dose for all adults aged 106 years and older.  Meningococcal vaccine.** / Consult your health care provider.  Hepatitis A vaccine.** / Consult your health care provider.  Hepatitis B vaccine.** / Consult your health care provider.  Haemophilus influenzae type b (Hib) vaccine.** / Consult your  health care provider. **Family history and personal history of risk and conditions may change your health care provider's recommendations. Document Released: 11/21/2001 Document  Revised: 09/30/2013 Document Reviewed: 02/20/2011 [facility] Patient Information 2015 [place], [place]. This information is not intended to replace advice given to you by your health care provider. Make sure you discuss any questions you have with your health care provider.

## 2015-03-27 LAB — HIV ANTIBODY (ROUTINE TESTING W REFLEX): HIV: NONREACTIVE

## 2015-03-29 LAB — METHYLMALONIC ACID, SERUM: Methylmalonic Acid, Quant: 105 nmol/L (ref 87–318)

## 2015-03-29 LAB — ANTINUCLEAR ANTIBODIES, IFA: ANA Titer 1: NEGATIVE

## 2015-03-30 ENCOUNTER — Telehealth: Payer: Self-pay | Admitting: Nurse Practitioner

## 2015-03-30 ENCOUNTER — Encounter: Payer: Self-pay | Admitting: Nurse Practitioner

## 2015-03-30 DIAGNOSIS — E559 Vitamin D deficiency, unspecified: Secondary | ICD-10-CM

## 2015-03-30 DIAGNOSIS — D696 Thrombocytopenia, unspecified: Secondary | ICD-10-CM | POA: Insufficient documentation

## 2015-03-30 MED ORDER — VITAMIN D3 1.25 MG (50000 UT) PO CAPS: 1.0000 | ORAL_CAPSULE | ORAL | Status: DC

## 2015-03-30 NOTE — Telephone Encounter (Signed)
LMOM for pt to CB.  

## 2015-03-30 NOTE — Telephone Encounter (Addendum)
pls call pt: Advise Labs look good overall: he is getting enough L57 & folic acid.  3 concerns vit D is low: start prescription D3. Take 1 capsule weekly for 3 mos. Sched lab appt to have D checked in 3 mos. platelets are slightly low, but better than in past. He should let us know if he has bleeding of gums, nose bleeds, dark stools, unexplained bruises. Bilirubin slightly high, but stable. Will monitor yearly. Unless he has concerns.  Hep B titer is good.  TSH is at goal. He may want to stick with brand name thyroid med as he may have less variation in levels. Take at least 1 hr before coffee & breakfast-OK to take during night if has to go to b-room.  Cholesterol panel looks good.   Did he call w/PPD results?

## 2015-03-31 ENCOUNTER — Encounter: Payer: Self-pay | Admitting: Nurse Practitioner

## 2015-03-31 ENCOUNTER — Telehealth: Payer: Self-pay | Admitting: Nurse Practitioner

## 2015-03-31 LAB — TB SKIN TEST
INDURATION: 0 mm
TB Skin Test: NEGATIVE

## 2015-03-31 LAB — VITAMIN B1: VITAMIN B1 (THIAMINE): 8 nmol/L (ref 8–30)

## 2015-03-31 NOTE — Telephone Encounter (Signed)
Pt aware of results.  States he hasn't noticed any bleeding, dark stools, or unexplained bruising.    IFOB was mailed to patient today.

## 2015-03-31 NOTE — Telephone Encounter (Signed)
pls call pt: Advise B1 came back today. It is low normal. I recommend he take a B complex to get 5mg  or more daily of b1 (thiamine).

## 2015-04-01 NOTE — Telephone Encounter (Signed)
Left detailed message on pt's cell.  Okay per DPR. 

## 2015-05-21 ENCOUNTER — Other Ambulatory Visit (INDEPENDENT_AMBULATORY_CARE_PROVIDER_SITE_OTHER): Payer: BC Managed Care – PPO

## 2015-05-21 DIAGNOSIS — Z1211 Encounter for screening for malignant neoplasm of colon: Secondary | ICD-10-CM

## 2015-05-21 LAB — FECAL OCCULT BLOOD, IMMUNOCHEMICAL: Fecal Occult Bld: NEGATIVE

## 2015-07-19 ENCOUNTER — Other Ambulatory Visit: Payer: Self-pay | Admitting: Family Medicine

## 2015-07-19 ENCOUNTER — Ambulatory Visit (INDEPENDENT_AMBULATORY_CARE_PROVIDER_SITE_OTHER): Payer: BC Managed Care – PPO | Admitting: Family Medicine

## 2015-07-19 ENCOUNTER — Encounter: Payer: Self-pay | Admitting: Family Medicine

## 2015-07-19 VITALS — BP 111/74 | HR 59 | Temp 98.0°F | Resp 16 | Ht 73.0 in | Wt 215.0 lb

## 2015-07-19 DIAGNOSIS — R05 Cough: Secondary | ICD-10-CM | POA: Diagnosis not present

## 2015-07-19 DIAGNOSIS — J209 Acute bronchitis, unspecified: Secondary | ICD-10-CM | POA: Diagnosis not present

## 2015-07-19 DIAGNOSIS — R058 Other specified cough: Secondary | ICD-10-CM

## 2015-07-19 MED ORDER — PREDNISONE 20 MG PO TABS: ORAL_TABLET | ORAL | Status: DC

## 2015-07-19 MED ORDER — ALBUTEROL SULFATE HFA 108 (90 BASE) MCG/ACT IN AERS: 2.0000 | INHALATION_SPRAY | RESPIRATORY_TRACT | Status: DC | PRN

## 2015-07-19 MED ORDER — PANTOPRAZOLE SODIUM 40 MG PO TBEC: 40.0000 mg | DELAYED_RELEASE_TABLET | Freq: Every day | ORAL | Status: DC

## 2015-07-19 NOTE — Progress Notes (Signed)
Pre visit review using our clinic review tool, if applicable. No additional management support is needed unless otherwise documented below in the visit note. 

## 2015-07-19 NOTE — Progress Notes (Signed)
OFFICE VISIT  07/19/2015   CC:  Chief Complaint  Patient presents with  . Shortness of Breath    x 1 week  . Wheezing  . Heartburn   HPI:    Patient is a 54 y.o. Caucasian male who presents for about 2-3 weeks of intermittent SOB, feels it more in morning when he walks for exercise. Hx of chronic PND: trying flonase and allegra.  Feeling more substernal burning c/w what he identifies as GERD. Says he coughs a lot, some wheezing noted this morning.   Has had past episodes of wheezing but no official dx of asthma, never rx'd inhaler. No fever, HA, ST, ear pains. Nonsmoker.  Has never had a CXR.  No exertional CP/pressure, no nausea, no palpitations, no arm or jaw pain.  Past Medical History  Diagnosis Date  . Allergy   . Pernicious anemia   . Hypothyroidism   . Hearing loss     L ear, has hearing aid  . Thrombocytopenia ([place])   . Gilbert disease   . Hemorrhoids   . Ulnar neuropathy at elbow of right upper extremity     Past Surgical History  Procedure Laterality Date  . Septoplasty  1987; 1993    with turbinate reduction, atroplasty  . Labral tear Right 2012    Outpatient Prescriptions Prior to Visit  Medication Sig Dispense Refill  . ALPRAZolam (XANAX) 0.25 MG tablet Take 0.25 mg by mouth as needed for anxiety.    . Cholecalciferol (VITAMIN D3) 50000 UNITS CAPS Take 1 capsule by mouth every 7 (seven) days. 12 capsule 0  . Cyanocobalamin 1000 MCG/ML KIT Inject 1,000 mcg as directed.    . folic acid (FOLVITE) 1 MG tablet Take 1 mg by mouth daily.    Marland Kitchen levothyroxine (SYNTHROID, LEVOTHROID) 200 MCG tablet Take 200 mcg by mouth daily before breakfast.     No facility-administered medications prior to visit.    Allergies  Allergen Reactions  . Penicillins     ROS As per HPI  PE: Blood pressure 111/74, pulse 59, temperature 98 F (36.7 C), temperature source Oral, resp. rate 16, height '6\' 1"'  (1.854 m), weight 215 lb (97.523 kg), SpO2 98 %. VS:  noted--normal. Gen: alert, NAD, NONTOXIC APPEARING. HEENT: eyes without injection, drainage, or swelling.  Ears: EACs clear, TMs with normal light reflex and landmarks.  Nose: Clear rhinorrhea, with some dried, crusty exudate adherent to mildly injected mucosa.  No purulent d/c.  No paranasal sinus TTP.  No facial swelling.  Throat and mouth without focal lesion.  No pharyngial swelling, erythema, or exudate.   Neck: supple, no LAD.   LUNGS: CTA bilat, nonlabored resps.  Some post-exhalation coughing. CV: RRR, no m/r/g. EXT: no c/c/e SKIN: no rash  LABS:  none  IMPRESSION AND PLAN:  1) Cough, multifactorial: suspect a component of upper airway cough syndrome. Continue current measures for allergic rhinitis w/PND. Add pantoprazole 52m qd. Part of this sounds like asthmatic bronchitis as well, so we'll do prednisone 453mqd x 5d, then 208md x 5d. ProAir HFA, 1-2 p q4h prn.  An After Visit Summary was printed and given to the patient.  FOLLOW UP: Return in about 3 weeks (around 08/09/2015) for f/u cough/SOB.

## 2015-08-13 ENCOUNTER — Ambulatory Visit (INDEPENDENT_AMBULATORY_CARE_PROVIDER_SITE_OTHER): Payer: BC Managed Care – PPO | Admitting: Family Medicine

## 2015-08-13 ENCOUNTER — Other Ambulatory Visit: Payer: Self-pay | Admitting: Family Medicine

## 2015-08-13 ENCOUNTER — Encounter: Payer: Self-pay | Admitting: Family Medicine

## 2015-08-13 VITALS — BP 119/79 | HR 65 | Temp 98.0°F | Resp 16 | Ht 73.0 in | Wt 213.0 lb

## 2015-08-13 DIAGNOSIS — R058 Other specified cough: Secondary | ICD-10-CM

## 2015-08-13 DIAGNOSIS — R05 Cough: Secondary | ICD-10-CM | POA: Diagnosis not present

## 2015-08-13 DIAGNOSIS — F320 Major depressive disorder, single episode, mild: Secondary | ICD-10-CM | POA: Diagnosis not present

## 2015-08-13 MED ORDER — ESCITALOPRAM OXALATE 10 MG PO TABS: 10.0000 mg | ORAL_TABLET | Freq: Every day | ORAL | Status: DC

## 2015-08-13 MED ORDER — AZITHROMYCIN 250 MG PO TABS: ORAL_TABLET | ORAL | Status: DC

## 2015-08-13 NOTE — Progress Notes (Signed)
OFFICE VISIT  08/13/2015   CC:  Chief Complaint  Patient presents with  . Follow-up    Cough and Shortness of breath  . Depression    New, would like to discuss   HPI:    Patient is a 54 y.o. Caucasian male who presents for 3 wk f/u cough--likely upper airway cough syndrome +/- some mild RAD component.  I rx'd him a 10d prednisone taper and proAir HFA last visit and added pantoprazole 66m qd.Co Says things are better regarding cough/SOB. Still concerned with excessive PND, L sided nasal congestion/runny nose.  No face pain or fevers.  He is very interested in a trial of an antibiotic to see if it helps this.  Complains of anhedonia, depressed mood, doesn't get any enjoyment out of work or other aspects of his life.  Trying to exercise.  He seems to withdraw more lately. This has been going on for months and getting gradually worse. He thinks he may have been on an antidepressant once in the past but he can't recall what it was (approx 2000). Has high stress occupation: dentist.  No SI or HI.  Past Medical History  Diagnosis Date  . Allergy   . Pernicious anemia   . Hypothyroidism   . Hearing loss     L ear, has hearing aid  . Thrombocytopenia ([facility] [place]   . Gilbert disease   . Hemorrhoids   . Ulnar neuropathy at elbow of right upper extremity     Past Surgical History  Procedure Laterality Date  . Septoplasty  1987; 1993    with turbinate reduction, atroplasty  . Labral tear Right 2012    Outpatient Prescriptions Prior to Visit  Medication Sig Dispense Refill  . albuterol (PROAIR HFA) 108 (90 BASE) MCG/ACT inhaler Inhale 2 puffs into the lungs every 4 (four) hours as needed for wheezing or shortness of breath. 1 Inhaler 0  . Cyanocobalamin 1000 MCG/ML KIT Inject 1,000 mcg as directed.    . fexofenadine (ALLEGRA) 180 MG tablet Take 180 mg by mouth daily.    . fluticasone (FLONASE) 50 MCG/ACT nasal spray Place 1 spray into both nostrils daily.    . folic acid (FOLVITE) 1  MG tablet Take 1 mg by mouth daily.    .Marland Kitchenlevothyroxine (SYNTHROID, LEVOTHROID) 200 MCG tablet Take 200 mcg by mouth daily before breakfast.    . pantoprazole (PROTONIX) 40 MG tablet TAKE 1 TABLET(40 MG) BY MOUTH DAILY 90 tablet 1  . ALPRAZolam (XANAX) 0.25 MG tablet Take 0.25 mg by mouth as needed for anxiety.    . Cholecalciferol (VITAMIN D3) 50000 UNITS CAPS Take 1 capsule by mouth every 7 (seven) days. (Patient not taking: Reported on 08/13/2015) 12 capsule 0  . predniSONE (DELTASONE) 20 MG tablet 2 tabs po qd x 5d, then 1 tab po qd x 5d (Patient not taking: Reported on 08/13/2015) 15 tablet 0   No facility-administered medications prior to visit.    Allergies  Allergen Reactions  . Penicillins     ROS As per HPI  PE: Blood pressure 119/79, pulse 65, temperature 98 F (36.7 C), temperature source Oral, resp. rate 16, height _0  (1.854 m), weight 213 lb (96.616 kg), SpO2 98 %. Wt Readings from Last 2 Encounters:  08/13/15 213 lb (96.616 kg)  07/19/15 215 lb (97.523 kg)    Gen: alert, oriented x 4, affect pleasant.  Lucid thinking and conversation noted. HEENT: PERRLA, EOMI.   Neck: no LAD, mass, or thyromegaly. CV: RRR,  no m/r/g LUNGS: CTA bilat, nonlabored. NEURO: no tremor or tics noted on observation.  Coordination intact. CN 2-12 grossly intact bilaterally, strength 5/5 in all extremeties.  No ataxia.   LABS:  Lab Results  Component Value Date   TSH 1.30 03/26/2015    IMPRESSION AND PLAN:  1) Major depression, mild/mod: start trial of lexapro 38m qd. Therapeutic expectations and side effect profile of medication discussed today.  Patient's questions answered.  2) Upper airway cough syndrome, question of RAD component. He'll continue current meds, has inhaler to use prn. He wants to try antibiotic for possible bacterial sinusitis component and I told him I would be ok with giving this a try, but this simply could be mild chronic allergic rhin or vasomotor  rhinitis sx's. Azith x 5d rx'd today.  An After Visit Summary was printed and given to the patient.  FOLLOW UP: Return in about 4 weeks (around 09/10/2015) for f/u mood/lexapro.

## 2015-08-13 NOTE — Progress Notes (Signed)
Pre visit review using our clinic review tool, if applicable. No additional management support is needed unless otherwise documented below in the visit note. 

## 2015-09-10 ENCOUNTER — Ambulatory Visit (INDEPENDENT_AMBULATORY_CARE_PROVIDER_SITE_OTHER): Payer: BC Managed Care – PPO | Admitting: Family Medicine

## 2015-09-10 ENCOUNTER — Encounter: Payer: Self-pay | Admitting: Family Medicine

## 2015-09-10 VITALS — BP 119/77 | HR 59 | Temp 97.8°F | Resp 16 | Ht 73.0 in | Wt 212.0 lb

## 2015-09-10 DIAGNOSIS — R05 Cough: Secondary | ICD-10-CM

## 2015-09-10 DIAGNOSIS — R058 Other specified cough: Secondary | ICD-10-CM

## 2015-09-10 DIAGNOSIS — F329 Major depressive disorder, single episode, unspecified: Secondary | ICD-10-CM | POA: Diagnosis not present

## 2015-09-10 DIAGNOSIS — F32A Depression, unspecified: Secondary | ICD-10-CM

## 2015-09-10 MED ORDER — ESCITALOPRAM OXALATE 20 MG PO TABS: 20.0000 mg | ORAL_TABLET | Freq: Every day | ORAL | Status: DC

## 2015-09-10 MED ORDER — ESCITALOPRAM OXALATE 10 MG PO TABS: 10.0000 mg | ORAL_TABLET | Freq: Every day | ORAL | Status: DC

## 2015-09-10 NOTE — Progress Notes (Signed)
Pre visit review using our clinic review tool, if applicable. No additional management support is needed unless otherwise documented below in the visit note. 

## 2015-09-10 NOTE — Progress Notes (Signed)
OFFICE NOTE  09/10/2015  CC:  Chief Complaint  Patient presents with  . Follow-up    Pt is not fasting.    HPI: Patient is a 54 y.o. Caucasian male who is here for 1 mo f/u depression, started lexapro 16m qd last visit. Doing well, feels a bit improved regarding mood, but wants to try to go to 283m Protonix helping: not as much cough since getting on this med.  Pertinent PMH:  Past medical, surgical, social, and family history reviewed and no changes are noted since last office visit.  MEDS:  Outpatient Prescriptions Prior to Visit  Medication Sig Dispense Refill  . albuterol (PROAIR HFA) 108 (90 BASE) MCG/ACT inhaler Inhale 2 puffs into the lungs every 4 (four) hours as needed for wheezing or shortness of breath. 1 Inhaler 0  . Cyanocobalamin 1000 MCG/ML KIT Inject 1,000 mcg as directed.    . escitalopram (LEXAPRO) 10 MG tablet Take 1 tablet (10 mg total) by mouth daily. 30 tablet 1  . fexofenadine (ALLEGRA) 180 MG tablet Take 180 mg by mouth daily.    . fluticasone (FLONASE) 50 MCG/ACT nasal spray Place 1 spray into both nostrils daily.    . folic acid (FOLVITE) 1 MG tablet Take 1 mg by mouth daily.    . Marland Kitchenevothyroxine (SYNTHROID, LEVOTHROID) 200 MCG tablet Take 200 mcg by mouth daily before breakfast.    . pantoprazole (PROTONIX) 40 MG tablet TAKE 1 TABLET(40 MG) BY MOUTH DAILY 90 tablet 1  . azithromycin (ZITHROMAX) 250 MG tablet 2 tabs po qd x 1d, then 1 tab po qd x 4d (Patient not taking: Reported on 09/10/2015) 6 tablet 0   No facility-administered medications prior to visit.    PE: Blood pressure 119/77, pulse 59, temperature 97.8 F (36.6 C), temperature source Oral, resp. rate 16, height _0  (1.854 m), weight 212 lb (96.163 kg), SpO2 96 %. Wt Readings from Last 2 Encounters:  09/10/15 212 lb (96.163 kg)  08/13/15 213 lb (96.616 kg)    Gen: alert, oriented x 4, affect pleasant.  Lucid thinking and conversation noted. HEENT: PERRLA, EOMI.   Neck: no LAD, mass, or  thyromegaly. CV: RRR, no m/r/g LUNGS: CTA bilat, nonlabored. NEURO: trace hand tremor L>R with arms held outstretched--upon questioning pt says this has been present prior to being on lexapro. No tics noted on observation.  Coordination intact. CN 2-12 grossly intact bilaterally, strength 5/5 in all extremeties.  No ataxia.   Lab Results  Component Value Date   TSH 1.30 03/26/2015   IMPRESSION AND PLAN:  1) Depression: slightly improved, tolerating med w/out side effect. Increase lexapro to 207md and he will call or e-mail me with how he's doing on this in a couple months. May return or call earlier if any problems.  2) Upper airway cough syndrome: continues to improve.  Cont current meds.  An After Visit Summary was printed and given to the patient.  FOLLOW UP: approx 6 mo

## 2015-10-02 ENCOUNTER — Other Ambulatory Visit: Payer: Self-pay | Admitting: Family Medicine

## 2015-10-05 NOTE — Telephone Encounter (Signed)
RF request for pantoprazole LOV: 09/10/15 Next ov: None Last written: 07/19/15 #90 w/ 1RF

## 2015-10-07 ENCOUNTER — Encounter: Payer: Self-pay | Admitting: Family Medicine

## 2015-10-07 DIAGNOSIS — K219 Gastro-esophageal reflux disease without esophagitis: Secondary | ICD-10-CM | POA: Insufficient documentation

## 2015-10-20 ENCOUNTER — Telehealth: Payer: Self-pay | Admitting: Family Medicine

## 2015-10-20 MED ORDER — OSELTAMIVIR PHOSPHATE 75 MG PO CAPS: 75.0000 mg | ORAL_CAPSULE | Freq: Every day | ORAL | Status: DC

## 2015-10-20 NOTE — Telephone Encounter (Signed)
Pt's son was diagnosed with positive influenza.  Pt requesting tamiflu be sent to his pharmacy, [facility] in [place].  Please let me know if this is okay.

## 2015-10-20 NOTE — Telephone Encounter (Signed)
Patient is not sick.  Rx sent for preventative tamiflu.  Pt aware.

## 2015-10-20 NOTE — Telephone Encounter (Signed)
If pt (Mr. Kormos) is NOT sick with flu-like symptoms, pls eRx tamiflu 75mg , 1 tab po qd x 10d, #10, no RF. If he has symptoms of the flu (fever, cough, ST, body aches, etc), then eRx tamiflu 75mg , 1 tab po bid x 5d, #10, no RF.

## 2015-11-03 ENCOUNTER — Encounter: Payer: Self-pay | Admitting: Family Medicine

## 2015-11-03 NOTE — Telephone Encounter (Signed)
Please advise. Thanks.  

## 2015-11-10 ENCOUNTER — Telehealth: Payer: Self-pay | Admitting: Family Medicine

## 2015-11-10 NOTE — Telephone Encounter (Signed)
Made pt an appt on Fri 12/10/15 at 1:15pm

## 2015-11-10 NOTE — Telephone Encounter (Signed)
See below

## 2015-11-10 NOTE — Telephone Encounter (Signed)
Yes he can

## 2015-11-10 NOTE — Telephone Encounter (Signed)
See below [place].

## 2015-11-10 NOTE — Telephone Encounter (Signed)
Patient's spouse expressed that she has already talked to Dr. Yong Channel about her husband becoming a new patient, but the patient can only come on Fridays for an appt.  Please advise if he can be scheduled on a Friday as a new patient.

## 2015-12-10 ENCOUNTER — Encounter: Payer: Self-pay | Admitting: Family Medicine

## 2015-12-10 ENCOUNTER — Other Ambulatory Visit: Payer: Self-pay | Admitting: Family Medicine

## 2015-12-10 ENCOUNTER — Ambulatory Visit (INDEPENDENT_AMBULATORY_CARE_PROVIDER_SITE_OTHER): Payer: BC Managed Care – PPO | Admitting: Family Medicine

## 2015-12-10 DIAGNOSIS — E559 Vitamin D deficiency, unspecified: Secondary | ICD-10-CM | POA: Diagnosis not present

## 2015-12-10 DIAGNOSIS — L570 Actinic keratosis: Secondary | ICD-10-CM

## 2015-12-10 DIAGNOSIS — E039 Hypothyroidism, unspecified: Secondary | ICD-10-CM

## 2015-12-10 DIAGNOSIS — R5383 Other fatigue: Secondary | ICD-10-CM | POA: Diagnosis not present

## 2015-12-10 DIAGNOSIS — Z20828 Contact with and (suspected) exposure to other viral communicable diseases: Secondary | ICD-10-CM

## 2015-12-10 DIAGNOSIS — K219 Gastro-esophageal reflux disease without esophagitis: Secondary | ICD-10-CM

## 2015-12-10 DIAGNOSIS — D510 Vitamin B12 deficiency anemia due to intrinsic factor deficiency: Secondary | ICD-10-CM | POA: Diagnosis not present

## 2015-12-10 DIAGNOSIS — D696 Thrombocytopenia, unspecified: Secondary | ICD-10-CM

## 2015-12-10 DIAGNOSIS — F329 Major depressive disorder, single episode, unspecified: Secondary | ICD-10-CM | POA: Diagnosis not present

## 2015-12-10 DIAGNOSIS — F32A Depression, unspecified: Secondary | ICD-10-CM

## 2015-12-10 LAB — COMPREHENSIVE METABOLIC PANEL
ALBUMIN: 4.6 g/dL (ref 3.5–5.2)
ALK PHOS: 44 U/L (ref 39–117)
ALT: 18 U/L (ref 0–53)
AST: 16 U/L (ref 0–37)
BILIRUBIN TOTAL: 1.9 mg/dL — AB (ref 0.2–1.2)
BUN: 9 mg/dL (ref 6–23)
CALCIUM: 9.2 mg/dL (ref 8.4–10.5)
CO2: 31 mEq/L (ref 19–32)
Chloride: 105 mEq/L (ref 96–112)
Creatinine, Ser: 1.08 mg/dL (ref 0.40–1.50)
GFR: 75.79 mL/min (ref >60.00)
Glucose, Bld: 82 mg/dL (ref 70–99)
POTASSIUM: 4.1 meq/L (ref 3.5–5.1)
Sodium: 141 mEq/L (ref 135–145)
TOTAL PROTEIN: 6.6 g/dL (ref 6.0–8.3)

## 2015-12-10 LAB — CBC
HEMATOCRIT: 44.7 % (ref 39.0–52.0)
Hemoglobin: 15 g/dL (ref 13.0–17.0)
MCHC: 33.6 g/dL (ref 30.0–36.0)
MCV: 89.2 fl (ref 78.0–100.0)
PLATELETS: 134 10*3/uL — AB (ref 150.0–400.0)
RBC: 5.01 Mil/uL (ref 4.22–5.81)
RDW: 12.9 % (ref 11.5–15.5)
WBC: 5.2 10*3/uL (ref 4.0–10.5)

## 2015-12-10 LAB — TSH: TSH: 0.8 u[IU]/mL (ref 0.35–4.50)

## 2015-12-10 LAB — VITAMIN B12: VITAMIN B 12: 487 pg/mL (ref 211–911)

## 2015-12-10 LAB — VITAMIN D 25 HYDROXY (VIT D DEFICIENCY, FRACTURES): VITD: 23.46 ng/mL — ABNORMAL LOW (ref 30.00–100.00)

## 2015-12-10 MED ORDER — VITAMIN D (ERGOCALCIFEROL) 1.25 MG (50000 UNIT) PO CAPS: 50000.0000 [IU] | ORAL_CAPSULE | ORAL | Status: DC

## 2015-12-10 NOTE — Assessment & Plan Note (Signed)
S: Vit Ktoday remains below 30.  A/P: 12 weeks of 50k units ergocalciferol. Repeat in 13 weeks or at follow up- 1000 units daily after repletion.

## 2015-12-10 NOTE — Patient Instructions (Signed)
No changes today  Labs before you leave

## 2015-12-10 NOTE — Assessment & Plan Note (Signed)
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 Has been doingvery well. has been getting 1000 mcg B12 q14d w/good results. Wife Gregary Signs gives this.  A/P: B12 wnl today- continue q2 week injections

## 2015-12-10 NOTE — Progress Notes (Signed)
Eric Reddish, MD Phone: [phone]  Subjective:  Patient presents today to establish care. Chief complaint-noted.   See problem oriented charting  The following were reviewed and entered/updated in epic: Past Medical History  Diagnosis Date  . Allergy   . Pernicious anemia   . Hypothyroidism   . Hearing loss     L ear, has hearing aid. high speed suction over years  . Thrombocytopenia ([place])   . Gilbert disease   . Hemorrhoids   . Ulnar neuropathy at elbow of right upper extremity   . GERD (gastroesophageal reflux disease)   . Chicken pox    Patient Active Problem List   Diagnosis Date Noted  . Depression 09/10/2015    Priority: Medium  . Pernicious anemia 03/26/2015    Priority: Medium  . Hypothyroidism 12/20/2010    Priority: Medium  . GERD (gastroesophageal reflux disease)     Priority: Low  . Thrombocytopenia ([place]) 03/30/2015    Priority: Low  . 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 Actinic keratosis 03/26/2015    Priority: Low  . Vitamin D deficiency 12/10/2015   Past Surgical History  Procedure Laterality Date  . Septoplasty  1987; 1993    with turbinate reduction, atroplasty  . Shoulder surgery Right 2012    labral tear  . Tonsillectomy and adenoidectomy  1970    Family History  Problem Relation Age of Onset  . Pernicious anemia Mother   . Alcohol abuse Maternal Grandmother   . Diabetes Maternal Grandmother   . Pernicious anemia Maternal Grandfather   . Alcohol abuse Maternal Grandfather     Medications- reviewed and updated Current Outpatient Prescriptions  Medication Sig Dispense Refill  . Cyanocobalamin 1000 MCG/ML KIT Inject 1,000 mcg as directed.    . folic acid (FOLVITE) 1 MG tablet Take 1 mg by mouth daily.    Marland Kitchen levothyroxine (SYNTHROID, LEVOTHROID) 200 MCG tablet Take 200 mcg by mouth daily before breakfast.    . pantoprazole (PROTONIX) 40 MG tablet TAKE 1 TABLET(40 MG) BY MOUTH DAILY 90 tablet 3  . Vitamin D,  Ergocalciferol, (DRISDOL) 50000 units CAPS capsule Take 1 capsule (50,000 Units total) by mouth every 7 (seven) days. 12 capsule 0   No current facility-administered medications for this visit.    Allergies-reviewed and updated Allergies  Allergen Reactions  . Penicillins     Social History   Social History  . Marital Status: Married    Spouse Name: N/A  . Number of Children: 4  . Years of Education: N/A   Occupational History  . dentist    Social History Main Topics  . Smoking status: Never Smoker   . Smokeless tobacco: Never Used  . Alcohol Use: Yes     Comment: 1 weekly  . Drug Use: No  . Sexual Activity: Yes   Other Topics Concern  . None   Social History Narrative   Married. [name].      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 He lives with his wife & 3 sons (twins [name] and Jackson at [employer], older [place] at [place], 1 son is grown [name]- to be married 2017). He is from [place], lived in [place] before relocating to [place].      Hobbies: enjoys yardwork, hiking, back packing when able   Exercises at home- walking, working out, biking.     ROS--Full ROS was completed Review of Systems  Constitutional: Negative for fever and chills.  HENT: Negative  for hearing loss.   Eyes: Negative for blurred vision and double vision.  Respiratory: Negative for cough and shortness of breath.   Cardiovascular: Negative for chest pain and palpitations.  Gastrointestinal: Positive for heartburn. Negative for nausea and vomiting.  Genitourinary: Negative for dysuria and urgency.  Musculoskeletal: Negative for myalgias and neck pain.  Skin: Negative for itching and rash.  Neurological: Negative for tremors, speech change, focal weakness and headaches.  Endo/Heme/Allergies: Negative for polydipsia. Does not bruise/bleed easily.  Psychiatric/Behavioral: Positive for depression. Negative for suicidal ideas and substance abuse.   Objective: BP 108/60 mmHg  Pulse 66   Temp(Src) 98.5 F (36.9 C)  Wt 221 lb (100.245 kg) Gen: NAD, resting comfortably HEENT: Mucous membranes are moist. Oropharynx normal. TM normal. Eyes: sclera and lids normal, PERRLA Neck: no thyromegaly, no cervical lymphadenopathy CV: RRR no murmurs rubs or gallops Lungs: CTAB no crackles, wheeze, rhonchi Abdomen: soft/nontender/nondistended/normal bowel sounds. No rebound or guarding.  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 Has been doingvery well. has been getting 1000 mcg B12 q14d w/good results. Wife Gregary Signs gives this.  A/P: B12 wnl today- continue q2 week injections  Hypothyroidism S: controlled on levothyroxine 274mg Lab Results  Component Value Date   TSH 0.80 12/10/2015  A/P: doing well on check today- continue current rx   Depression S: Felt worse on lexapro- did not like libido, didn't increase energy level even at 272m Remains off medicine. Main issue- lack of free time. One of symptoms is likely fatigue  A/P: despite still having some depressed mood- declines medication. Hopeful once he gets practice volume up enough to bring on associate that things will settle down. We discussed  wellbutrin as alternate to SSRI given libido issues. No SI/HI  Thrombocytopenia ([facility]) S: longstanding issue without recent worsening A/P: stable in 100-150 range on labs today- continue to trend at least yearly as long as other cell lines stable   Vitamin D deficiency S: Vit Ktoday remains below 30.  A/P: 12 weeks of 50k units ergocalciferol. Repeat in 13 weeks or at follow up- 1000 units daily after repletion.     1 year CPE. Return precautions advised.   Orders Placed This Encounter  Procedures  . CBC    [place]  . Comprehensive  metabolic panel    [place]  . TSH    [place]  . Vitamin B12  . VITAMIN D 25 Hydroxy (Vit-D Deficiency, Fractures)    [place]  . Hepatitis C antibody, reflex    [facility]    Meds ordered this encounter  Medications  . Vitamin D, Ergocalciferol, (DRISDOL) 50000 units CAPS capsule    Sig: Take 1 capsule (50,000 Units total) by mouth every 7 (seven) days.    Dispense:  12 capsule    Refill:  0

## 2015-12-10 NOTE — Assessment & Plan Note (Addendum)
S: longstanding issue without recent worsening A/P: stable in 100-150 range on labs today- continue to trend at least yearly as long as other cell lines stable

## 2015-12-10 NOTE — Assessment & Plan Note (Signed)
S: controlled on levothyroxine 269mcg Lab Results  Component Value Date   TSH 0.80 12/10/2015  A/P: doing well on check today- continue current rx

## 2015-12-10 NOTE — Assessment & Plan Note (Addendum)
S: Felt worse on lexapro- did not like libido, didn't increase energy level even at 20mg . Remains off medicine. Main issue- lack of free time. One of symptoms is likely fatigue  A/P: despite still having some depressed mood- declines medication. Hopeful once he gets practice volume up enough to bring on associate that things will settle down. We discussed  wellbutrin as alternate to SSRI given libido issues. No SI/HI

## 2015-12-11 LAB — HEPATITIS C ANTIBODY: HCV Ab: REACTIVE — AB

## 2015-12-14 LAB — HEPATITIS C RNA QUANTITATIVE: HCV Quantitative: NOT DETECTED IU/mL (ref <15)

## 2015-12-23 ENCOUNTER — Other Ambulatory Visit: Payer: Self-pay | Admitting: Family Medicine

## 2016-03-10 ENCOUNTER — Ambulatory Visit (INDEPENDENT_AMBULATORY_CARE_PROVIDER_SITE_OTHER): Payer: BC Managed Care – PPO | Admitting: Family Medicine

## 2016-03-10 ENCOUNTER — Encounter: Payer: Self-pay | Admitting: Family Medicine

## 2016-03-10 VITALS — BP 102/78 | Wt 201.0 lb

## 2016-03-10 DIAGNOSIS — M545 Low back pain, unspecified: Secondary | ICD-10-CM

## 2016-03-10 DIAGNOSIS — E559 Vitamin D deficiency, unspecified: Secondary | ICD-10-CM | POA: Diagnosis not present

## 2016-03-10 NOTE — Progress Notes (Signed)
Subjective:  Eric Vazquez is a 55 y.o. year old very pleasant male patient who presents for/with See problem oriented charting ROS- see any ROS included in HPI as well.   Past Medical History-  Patient Active Problem List   Diagnosis Date Noted  . Depression 09/10/2015    Priority: Medium  . Pernicious anemia 03/26/2015    Priority: Medium  . Hypothyroidism 12/20/2010    Priority: Medium  . GERD (gastroesophageal reflux disease)     Priority: Low  . Thrombocytopenia ([place]) 03/30/2015    Priority: Low  . 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 Actinic keratosis 03/26/2015    Priority: Low  . Vitamin D deficiency 12/10/2015    Medications- reviewed and updated Current Outpatient Prescriptions  Medication Sig Dispense Refill  . Cyanocobalamin 1000 MCG/ML KIT Inject 1,000 mcg as directed.    . folic acid (FOLVITE) 1 MG tablet Take 1 mg by mouth daily.    . pantoprazole (PROTONIX) 40 MG tablet TAKE 1 TABLET(40 MG) BY MOUTH DAILY 90 tablet 3  . SYNTHROID 200 MCG tablet TAKE 1 TABLET BY MOUTH EVERY DAY 30 tablet 5  . Vitamin D, Ergocalciferol, (DRISDOL) 50000 units CAPS capsule Take 1 capsule (50,000 Units total) by mouth every 7 (seven) days. 12 capsule 0   No current facility-administered medications for this visit.    Objective: BP 102/78 mmHg  Wt 201 lb (91.173 kg) Gen: NAD, resting comfortably CV: RRR no murmurs rubs or gallops Lungs: CTAB no crackles, wheeze, rhonchi Abdomen: soft/nontender/nondistended/normal bowel sounds. No rebound or guarding.  Ext: no edema Skin: warm, dry Back - Normal skin, Spine with normal alignment and no deformity.  No tenderness to vertebral process palpation.  Paraspinous muscles are tender and tight bilaterally but L>R.  Range of motion is full at neck and lumbar sacral regions. Negative Straight leg raise. No pain with maneuvering of hips.  Neuro- no saddle anesthesia, 5/5 strength lower extremities, 2+  reflexes  Assessment/Plan:  Back Pain S: Left low back pain, chronic. Dealing with this for at least 14 years off and on- right now seems to be worse. Seems to be worse after work. Stiffness in low back. Sits to right of patient and has to lean in. Does not sleep as well tosses and turning due to back hurting at times. Seems to decrease his pleasure with work. Tried heat, occasional ibuprofen. Occasional massage no real improvement. No prior imaging  He was concerned weight could be playing a role. Recently did Weight watchers and has lost 20 lbs. Watching food primarily. 10k steps a day- walking, before during and after work. Not doing any core strengthening though. Wants to consider PT  ROS-No saddle anesthesia, bladder incontinence, fecal incontinence, weakness in extremity, numbness or tingling in extremity. History negative for trauma, history of cancer, fever, chills, unintentional weight loss, recent bacterial infection, recent IV drug use, HIV, pain worse at night or while supine.  A/P: Patient requested PT referral. We discussed option of getting Dr. Ezekiel Slocumb opinion as he could recommend great PT but also potentially use osteopathic medicine skillset. Did not order imaging at this time but this could be considered as well. Fortunately no red flags other than duration of pain.   We also rechecked vitamin D today as previously at 13 but has improved up to 50 ng/mL. Patient can use 1000 IU daily for maintenance if he would like.   Return precautions advised.   Orders Placed This Encounter  Procedures  .  VITAMIN D 25 Hydroxy (Vit-D Deficiency, Fractures)  . Ambulatory referral to Sports Medicine    Referral Priority:  Routine    Referral Type:  Consultation    Referred to Provider:  Hulan Saas, MD    Number of Visits Requested:  1   [name], MD

## 2016-03-10 NOTE — Patient Instructions (Addendum)
We will call you within a week about your referral to sports medicine with Dr. Charlann Boxer. If you do not hear within 2 weeks, give [place] a call.   I think the counseling is a great idea as well.   Check vitamin D before you leave

## 2016-03-11 LAB — VITAMIN D 25 HYDROXY (VIT D DEFICIENCY, FRACTURES): Vit D, 25-Hydroxy: 50 ng/mL (ref 30–100)

## 2016-03-17 ENCOUNTER — Encounter: Payer: Self-pay | Admitting: Family Medicine

## 2016-03-17 ENCOUNTER — Ambulatory Visit (INDEPENDENT_AMBULATORY_CARE_PROVIDER_SITE_OTHER): Payer: BC Managed Care – PPO | Admitting: Family Medicine

## 2016-03-17 VITALS — BP 120/82 | HR 58 | Ht 73.0 in | Wt 200.0 lb

## 2016-03-17 DIAGNOSIS — M9903 Segmental and somatic dysfunction of lumbar region: Secondary | ICD-10-CM

## 2016-03-17 DIAGNOSIS — M24552 Contracture, left hip: Secondary | ICD-10-CM

## 2016-03-17 DIAGNOSIS — M9902 Segmental and somatic dysfunction of thoracic region: Secondary | ICD-10-CM

## 2016-03-17 DIAGNOSIS — M9904 Segmental and somatic dysfunction of sacral region: Secondary | ICD-10-CM | POA: Diagnosis not present

## 2016-03-17 DIAGNOSIS — M999 Biomechanical lesion, unspecified: Secondary | ICD-10-CM

## 2016-03-17 MED ORDER — DICLOFENAC SODIUM 2 % TD SOLN: TRANSDERMAL | Status: DC

## 2016-03-17 NOTE — Progress Notes (Signed)
Pre visit review using our clinic review tool, if applicable. No additional management support is needed unless otherwise documented below in the visit note. 

## 2016-03-17 NOTE — Progress Notes (Signed)
[facility] [place], [place] [address] Phone: [phone] Subjective:    I'm seeing this patient by the request  of:  Eric Reddish, MD  CC: Back pain  [id number] Eric Vazquez is a 55 y.o. male coming in with complaint of low back pain. Patient states that his been left-sided. Going on multiple years. 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 States that he has recently been working out more regularly and has lost 20 pounds. Trying to be more active. Finding it difficult secondary to the discomfort. Patient denies any radiation down the leg or any numbness or tingling. Rates the severity of pain is more 5 out of 10. Tries to avoid even over-the-counter anti-inflammatories. States that he does take it it does seem to help. Patient states he changes position or stretches it seems to help as well. Denies any fevers chills or any abnormal weight loss.     Past Medical History  Diagnosis Date  . Allergy   . Pernicious anemia   . Hypothyroidism   . Hearing loss     L ear, has hearing aid. high speed suction over years  . Thrombocytopenia ([place])   . Gilbert disease   . Hemorrhoids   . Ulnar neuropathy at elbow of right upper extremity   . GERD (gastroesophageal reflux disease)   . Chicken pox    Past Surgical History  Procedure Laterality Date  . Septoplasty  1987; 1993    with turbinate reduction, atroplasty  . Shoulder surgery Right 2012    labral tear  . Tonsillectomy and adenoidectomy  1970   Social History   Social History  . Marital Status: Married    Spouse Name: N/A  . Number of Children: 4  . Years of Education: N/A   Occupational History  . dentist    Social History Main Topics  . Smoking status: Never Smoker   . Smokeless tobacco: Never Used  . Alcohol Use: Yes     Comment: 1 weekly  . Drug Use: No  . Sexual Activity: Yes   Other Topics Concern  . None   Social History Narrative     Married. Eric Vazquez.      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 He lives with his wife & 3 sons (twins Eric Vazquez and Eric Vazquez at [place], older Eric Vazquez at [place], 1 son is grown Eric Vazquez- to be married 2017). He is from [place], lived in [place] before relocating to [place].      Hobbies: enjoys yardwork, hiking, back packing when able   Exercises at home- walking, working out, biking.    Allergies  Allergen Reactions  . Penicillins    Family History  Problem Relation Age of Onset  . Pernicious anemia Mother   . Alcohol abuse Maternal Grandmother   . Diabetes Maternal Grandmother   . Pernicious anemia Maternal Grandfather   . Alcohol abuse Maternal Grandfather     Past medical history, social, surgical and family history all reviewed in electronic medical record.  No pertanent information unless stated regarding to the chief complaint.   Review of Systems: No headache, visual changes, nausea, vomiting, diarrhea, constipation, dizziness, abdominal pain, skin rash, fevers, chills, night sweats, weight loss, swollen lymph nodes, body aches, joint swelling, muscle aches, chest pain, shortness of breath, mood changes.   Objective Blood pressure 120/82, pulse 58, height 6\' 1"  (1.854 m), weight 200 lb (90.719 kg), SpO2  98 %.  General: No apparent distress alert and oriented x3 mood and affect normal, dressed appropriately.  HEENT: Pupils equal, extraocular movements intact  Respiratory: Patient's speak in full sentences and does not appear short of breath  Cardiovascular: No lower extremity edema, non tender, no erythema  Skin: Warm dry intact with no signs of infection or rash on extremities or on axial skeleton.  Abdomen: Soft nontender  Neuro: Cranial nerves II through XII are intact, neurovascularly intact in all extremities with 2+ DTRs and 2+ pulses.  Lymph: No lymphadenopathy of posterior or anterior cervical chain or axillae bilaterally.  Gait normal with good balance and  coordination.  MSK:  Non tender with full range of motion and good stability and symmetric strength and tone of shoulders, elbows, wrist, hip, knee and ankles bilaterally.  Back Exam:  Inspection: Unremarkable  Motion: Flexion 35 deg, Extension 25 deg, Side Bending to 30 deg bilaterally,  Rotation to 35 deg bilaterally  SLR laying: Negative  XSLR laying: Negative  Palpable tenderness: Most tenderness and appears palmar musculature of the lumbar spine as well as the left sacroiliac joint. FABER: negative. Sensory change: Gross sensation intact to all lumbar and sacral dermatomes.  Reflexes: 2+ at both patellar tendons, 2+ at achilles tendons, Babinski's downgoing.  Strength at foot  Plantar-flexion: 5/5 Dorsi-flexion: 5/5 Eversion: 5/5 Inversion: 5/5  Leg strength  Quad: 5/5 Hamstring: 5/5 Hip flexor: 5/5 Hip abductors: 5/5  Gait unremarkable. ] Osteopathic findings C2 flexed rotated and side bent right T3 extended rotated and side bent right L2 flexed rotated and side bent left Sacrum right on right  Procedure note [id number]; 15 additional minutes spent for Therapeutic exercises as stated in above notes.  This included exercises focusing on stretching, strengthening, with significant focus on eccentric aspects. Low back exercises that included:  Pelvic tilt/bracing instruction to focus on control of the pelvic girdle and lower abdominal muscles  Glute strengthening exercises, focusing on proper firing of the glutes without engaging the low back muscles Proper stretching techniques for maximum relief for the hamstrings, hip flexors, low back and some rotation where tolerated  Proper technique shown and discussed handout in great detail with ATC.  All questions were discussed and answered.      Impression and Recommendations:     This case required medical decision making of moderate complexity.      Note: This dictation was prepared with Dragon dictation along with smaller phrase  technology. Any transcriptional errors that result from this process are unintentional.

## 2016-03-17 NOTE — Patient Instructions (Signed)
Image [id number] is good.  When sitting try to keep shoulder down and back. Attempt not to lean to one side too much.  In the car consider a tennis ball between shoulder blades to keep them back  Ice 20 minutes 2 times daily. Usually after activity and before bed. Exercises 3 times a week.  pennsaid pinkie amount topically 2 times daily as needed.  Vitamin D 2000 IU daily  Turmeric 500mg  twice daily  We really need to stretch the hip flexors and strengthen the hip abductors are key.  See me again in 3-4 weeks and we will work you over again.

## 2016-03-17 NOTE — Assessment & Plan Note (Signed)
Decision today to treat with OMT was based on Physical Exam  After verbal consent patient was treated with HVLA, ME, techniques in cervical, thoracic, lumbar and sacral areas  Patient tolerated the procedure well with improvement in symptoms  Patient given exercises, stretches and lifestyle modifications  See medications in patient instructions if given  Patient will follow up in 3-4 weeks

## 2016-03-17 NOTE — Assessment & Plan Note (Signed)
Patient did bring pictures of his ergonomics. Patient likes to be in a flexed position with shoulders protracted as well as patient leaning to left side. This can cause more tightness of the hip flexor. Discussed some ergonomic changes at work with Product/process development scientist to learn different stretching exercises and strengthening and conditioning a could be beneficial. Discussed core strengthening and hip abductor's. Discussed over-the-counter medications and given different from topical anti-inflammatories. Did respond well to osteopathic manipulation. Will follow-up again in 3-4 weeks. Any worsening symptoms or radicular symptoms we'll consider x-rays at follow-up.

## 2016-04-03 ENCOUNTER — Ambulatory Visit (INDEPENDENT_AMBULATORY_CARE_PROVIDER_SITE_OTHER): Payer: BC Managed Care – PPO | Admitting: Psychology

## 2016-04-03 DIAGNOSIS — F4323 Adjustment disorder with mixed anxiety and depressed mood: Secondary | ICD-10-CM

## 2016-04-14 ENCOUNTER — Encounter: Payer: Self-pay | Admitting: Family Medicine

## 2016-04-14 ENCOUNTER — Ambulatory Visit (INDEPENDENT_AMBULATORY_CARE_PROVIDER_SITE_OTHER): Payer: BC Managed Care – PPO | Admitting: Family Medicine

## 2016-04-14 VITALS — BP 106/72 | HR 69 | Ht 73.0 in | Wt 201.0 lb

## 2016-04-14 DIAGNOSIS — M9903 Segmental and somatic dysfunction of lumbar region: Secondary | ICD-10-CM

## 2016-04-14 DIAGNOSIS — M9904 Segmental and somatic dysfunction of sacral region: Secondary | ICD-10-CM

## 2016-04-14 DIAGNOSIS — M9902 Segmental and somatic dysfunction of thoracic region: Secondary | ICD-10-CM

## 2016-04-14 DIAGNOSIS — M24552 Contracture, left hip: Secondary | ICD-10-CM | POA: Diagnosis not present

## 2016-04-14 DIAGNOSIS — M999 Biomechanical lesion, unspecified: Secondary | ICD-10-CM

## 2016-04-14 NOTE — Assessment & Plan Note (Signed)
Decision today to treat with OMT was based on Physical Exam  After verbal consent patient was treated with HVLA, ME, techniques in cervical, thoracic, lumbar and sacral areas  Patient tolerated the procedure well with improvement in symptoms  Patient given exercises, stretches and lifestyle modifications  See medications in patient instructions if given  Patient will follow up in 6 weeks

## 2016-04-14 NOTE — Assessment & Plan Note (Signed)
Continues to have tendon flexor as well as sacroiliac joints that I think is contribute into his discomfort.discuss if any worsening symptoms x-rays will be needed. Patient though seems to be responding. Doing well with the osteopathic manipulation. Encourage more core strengthening and we did show patient some other stretches to help the sacrum itself. Patient will come back and see me again in 6 weeks

## 2016-04-14 NOTE — Progress Notes (Signed)
Pre visit review using our clinic review tool, if applicable. No additional management support is needed unless otherwise documented below in the visit note. 

## 2016-04-14 NOTE — Progress Notes (Signed)
[name] [place], [place] [address] Phone: [phone] Subjective:    I'm seeing this patient by the request  of:  [name], MD  CC: Back pain follow-up  [id number] Kylie Lotti is a 55 y.o. male coming in with complaint of low back pain. Patient was found to have more of a sacroiliac dysfunction and poor posture. Patient is been doing the exercises fairly regularly. When he does he does notice significant improvement. When he does not do any does have more tightness again. Patient is having difficulty making its a regular regimen. Has been traveling recently and that has caused some mild discomfort. Nothing severe. Starting noticing some improvement with the different medications as well as icing protocol.     Past Medical History  Diagnosis Date  . Allergy   . Pernicious anemia   . Hypothyroidism   . Hearing loss     L ear, has hearing aid. high speed suction over years  . Thrombocytopenia ([place])   . Gilbert disease   . Hemorrhoids   . Ulnar neuropathy at elbow of right upper extremity   . GERD (gastroesophageal reflux disease)   . Chicken pox    Past Surgical History  Procedure Laterality Date  . Septoplasty  1987; 1993    with turbinate reduction, atroplasty  . Shoulder surgery Right 2012    labral tear  . Tonsillectomy and adenoidectomy  1970   Social History   Social History  . Marital Status: Married    Spouse Name: N/A  . Number of Children: 4  . Years of Education: N/A   Occupational History  . dentist    Social History Main Topics  . Smoking status: Never Smoker   . Smokeless tobacco: Never Used  . Alcohol Use: Yes     Comment: 1 weekly  . Drug Use: No  . Sexual Activity: Yes   Other Topics Concern  . None   Social History Narrative   Married. [name].      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 He lives with his wife & 3 sons (twins [name] and [place] at  [facility], older [place] at [place], 1 son is grown [name]- to be married 2017). He is from [place], lived in [place] before relocating to [place].      Hobbies: enjoys yardwork, hiking, back packing when able   Exercises at home- walking, working out, biking.    Allergies  Allergen Reactions  . Penicillins    Family History  Problem Relation Age of Onset  . Pernicious anemia Mother   . Alcohol abuse Maternal Grandmother   . Diabetes Maternal Grandmother   . Pernicious anemia Maternal Grandfather   . Alcohol abuse Maternal Grandfather     Past medical history, social, surgical and family history all reviewed in electronic medical record.  No pertanent information unless stated regarding to the chief complaint.   Review of Systems: No headache, visual changes, nausea, vomiting, diarrhea, constipation, dizziness, abdominal pain, skin rash, fevers, chills, night sweats, weight loss, swollen lymph nodes, body aches, joint swelling, muscle aches, chest pain, shortness of breath, mood changes.   Objective Blood pressure 106/72, pulse 69, height 6\' 1"  (1.854 m), weight 201 lb (91.173 kg), SpO2 97 %.  General: No apparent distress alert and oriented x3 mood and affect normal, dressed appropriately.  HEENT: Pupils equal, extraocular movements intact  Respiratory: Patient's speak in full sentences and does not appear short  of breath  Cardiovascular: No lower extremity edema, non tender, no erythema  Skin: Warm dry intact with no signs of infection or rash on extremities or on axial skeleton.  Abdomen: Soft nontender  Neuro: Cranial nerves II through XII are intact, neurovascularly intact in all extremities with 2+ DTRs and 2+ pulses.  Lymph: No lymphadenopathy of posterior or anterior cervical chain or axillae bilaterally.  Gait normal with good balance and coordination.  MSK:  Non tender with full range of motion and good stability and symmetric strength and tone of shoulders, elbows, wrist, hip, knee and ankles  bilaterally.  Back Exam:  Inspection: Unremarkable  Motion: Flexion 35 deg, Extension 25 deg, Side Bending to 30 deg bilaterally,  Rotation to 35 deg bilaterally  SLR laying: Negative  XSLR laying: Negative  Palpable tenderness: still pain over the left sacroiliac jointstill some tightness of the hip flexor as well. FABER: negative. Sensory change: Gross sensation intact to all lumbar and sacral dermatomes.  Reflexes: 2+ at both patellar tendons, 2+ at achilles tendons, Babinski's downgoing.  Strength at foot  Plantar-flexion: 5/5 Dorsi-flexion: 5/5 Eversion: 5/5 Inversion: 5/5  Leg strength  Quad: 5/5 Hamstring: 5/5 Hip flexor: 5/5 Hip abductors: 5/5  Gait unremarkable.  Osteopathic findings C2 flexed rotated and side bent right T5 extended rotated and side bent right L2 flexed rotated and side bent left L4 flexed rotated and side bent right Sacrum right on right       Impression and Recommendations:     This case required medical decision making of moderate complexity.      Note: This dictation was prepared with Dragon dictation along with smaller phrase technology. Any transcriptional errors that result from this process are unintentional.

## 2016-04-14 NOTE — Patient Instructions (Signed)
See you again in 6 weeks  

## 2016-04-28 ENCOUNTER — Ambulatory Visit (INDEPENDENT_AMBULATORY_CARE_PROVIDER_SITE_OTHER): Payer: BC Managed Care – PPO | Admitting: Psychology

## 2016-04-28 DIAGNOSIS — F4323 Adjustment disorder with mixed anxiety and depressed mood: Secondary | ICD-10-CM

## 2016-05-26 ENCOUNTER — Ambulatory Visit (INDEPENDENT_AMBULATORY_CARE_PROVIDER_SITE_OTHER): Payer: BC Managed Care – PPO | Admitting: Psychology

## 2016-05-26 ENCOUNTER — Ambulatory Visit: Payer: Self-pay | Admitting: Family Medicine

## 2016-05-26 DIAGNOSIS — F4323 Adjustment disorder with mixed anxiety and depressed mood: Secondary | ICD-10-CM | POA: Diagnosis not present

## 2016-06-01 NOTE — Progress Notes (Signed)
[facility] [place], [place] [address] Phone: [phone] Subjective:    I'm seeing this patient by the request  of:  [name], MD  CC: Back pain follow-up  [id number]  Eric Vazquez is a 55 y.o. male coming in with complaint of low back pain. Patient was found to have more of a sacroiliac dysfunction and poor posture. Has been working on the tight hip flexors. Patient has been trying to work on Personal assistant. Continues to lose weight which is making until better overall. Still not doing exercises quite religiously. Not using the topical anti-inflammatory's. Finish with the once weekly vitamin D.     Past Medical History:  Diagnosis Date  . Allergy   . Chicken pox   . GERD (gastroesophageal reflux disease)   . Gilbert disease   . Hearing loss    L ear, has hearing aid. high speed suction over years  . Hemorrhoids   . Hypothyroidism   . Pernicious anemia   . Thrombocytopenia ([place])   . Ulnar neuropathy at elbow of right upper extremity    Past Surgical History:  Procedure Laterality Date  . SEPTOPLASTY  [id number]; 1993   with turbinate reduction, atroplasty  . SHOULDER SURGERY Right 2012   labral tear  . TONSILLECTOMY AND ADENOIDECTOMY  1970   Social History   Social History  . Marital status: Married    Spouse name: N/A  . Number of children: 4  . Years of education: N/A   Occupational History  . dentist    Social History Main Topics  . Smoking status: Never Smoker  . Smokeless tobacco: Never Used  . Alcohol use Yes     Comment: 1 weekly  . Drug use: No  . Sexual activity: Yes   Other Topics Concern  . None   Social History Narrative   Married. [name].      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 He lives with his wife & 3 sons (twins [name] and [place] at [facility], older [place] at [place], 1 son is grown [name]- to be married 2017). He is from [place], lived in [place] before relocating to [place].        Hobbies: enjoys yardwork, hiking, back packing when able   Exercises at home- walking, working out, biking.    Allergies  Allergen Reactions  . Penicillins    Family History  Problem Relation Age of Onset  . Pernicious anemia Mother   . Alcohol abuse Maternal Grandmother   . Diabetes Maternal Grandmother   . Pernicious anemia Maternal Grandfather   . Alcohol abuse Maternal Grandfather     Past medical history, social, surgical and family history all reviewed in electronic medical record.  No pertanent information unless stated regarding to the chief complaint.   Review of Systems: No headache, visual changes, nausea, vomiting, diarrhea, constipation, dizziness, abdominal pain, skin rash, fevers, chills, night sweats, weight loss, swollen lymph nodes, body aches, joint swelling, muscle aches, chest pain, shortness of breath, mood changes.   Objective  Blood pressure 114/80, pulse (!) 54, weight 201 lb (91.2 kg), SpO2 98 %.  General: No apparent distress alert and oriented x3 mood and affect normal, dressed appropriately.  HEENT: Pupils equal, extraocular movements intact  Respiratory: Patient's speak in full sentences and does not appear short of breath  Cardiovascular: No lower extremity edema, non tender, no erythema  Skin: Warm dry intact with no signs of infection or  rash on extremities or on axial skeleton.  Abdomen: Soft nontender  Neuro: Cranial nerves II through XII are intact, neurovascularly intact in all extremities with 2+ DTRs and 2+ pulses.  Lymph: No lymphadenopathy of posterior or anterior cervical chain or axillae bilaterally.  Gait normal with good balance and coordination.  MSK:  Non tender with full range of motion and good stability and symmetric strength and tone of shoulders, elbows, wrist, hip, knee and ankles bilaterally.  Back Exam:  Inspection: Unremarkable  Motion: Flexion 35 deg, Extension 25 deg, Side Bending to 30 deg bilaterally,  Rotation to 35  deg bilaterally  SLR laying: Negative  XSLR laying: Negative  Palpable tenderness: Mild discomfort over the sacroiliac joint. Still significant tightness of the hip flexor. FABER: negative. Sensory change: Gross sensation intact to all lumbar and sacral dermatomes.  Reflexes: 2+ at both patellar tendons, 2+ at achilles tendons, Babinski's downgoing.  Strength at foot  Plantar-flexion: 5/5 Dorsi-flexion: 5/5 Eversion: 5/5 Inversion: 5/5  Leg strength  Quad: 5/5 Hamstring: 5/5 Hip flexor: 5/5 Hip abductors: 5/5  Gait unremarkable.  Osteopathic findings C2 flexed rotated and side bent right T5 extended rotated and side bent right L2 flexed rotated and side bent left L4 flexed rotated and side bent right Sacrum right on right       Impression and Recommendations:     This case required medical decision making of moderate complexity.      Note: This dictation was prepared with Dragon dictation along with smaller phrase technology. Any transcriptional errors that result from this process are unintentional.

## 2016-06-02 ENCOUNTER — Encounter: Payer: Self-pay | Admitting: Family Medicine

## 2016-06-02 ENCOUNTER — Ambulatory Visit (INDEPENDENT_AMBULATORY_CARE_PROVIDER_SITE_OTHER): Payer: BC Managed Care – PPO | Admitting: Family Medicine

## 2016-06-02 VITALS — BP 114/80 | HR 54 | Wt 201.0 lb

## 2016-06-02 DIAGNOSIS — M24552 Contracture, left hip: Secondary | ICD-10-CM | POA: Diagnosis not present

## 2016-06-02 DIAGNOSIS — M9903 Segmental and somatic dysfunction of lumbar region: Secondary | ICD-10-CM

## 2016-06-02 DIAGNOSIS — M9904 Segmental and somatic dysfunction of sacral region: Secondary | ICD-10-CM

## 2016-06-02 DIAGNOSIS — M9902 Segmental and somatic dysfunction of thoracic region: Secondary | ICD-10-CM

## 2016-06-02 DIAGNOSIS — Z23 Encounter for immunization: Secondary | ICD-10-CM | POA: Diagnosis not present

## 2016-06-02 DIAGNOSIS — M999 Biomechanical lesion, unspecified: Secondary | ICD-10-CM

## 2016-06-02 NOTE — Assessment & Plan Note (Signed)
Patient is making some progress. We discussed home exercises, icing protocol, we discussed more time for himself. Patient will be sent to formal physical therapy when he decides on where he would like to go. Patient and will come back and see me again in 6-8 weeks for further evaluation and treatment.

## 2016-06-02 NOTE — Patient Instructions (Addendum)
Great to see you  Gave you your flu shot today  The hip flexor stretching and the hip abductor strengthening will be better.  Call me or send me a message on the PT and I will refer you Overall you are making improvement.  Lets try 6-8 weeks.

## 2016-06-02 NOTE — Assessment & Plan Note (Signed)
Decision today to treat with OMT was based on Physical Exam  After verbal consent patient was treated with HVLA, ME, techniques in cervical, thoracic, lumbar and sacral areas  Patient tolerated the procedure well with improvement in symptoms  Patient given exercises, stretches and lifestyle modifications  See medications in patient instructions if given  Patient will follow up in 6-8 weeks

## 2016-06-23 ENCOUNTER — Other Ambulatory Visit: Payer: Self-pay | Admitting: Family Medicine

## 2016-06-30 ENCOUNTER — Ambulatory Visit (INDEPENDENT_AMBULATORY_CARE_PROVIDER_SITE_OTHER): Payer: BC Managed Care – PPO | Admitting: Psychology

## 2016-06-30 DIAGNOSIS — F4323 Adjustment disorder with mixed anxiety and depressed mood: Secondary | ICD-10-CM

## 2016-07-21 ENCOUNTER — Encounter: Payer: Self-pay | Admitting: Family Medicine

## 2016-07-21 ENCOUNTER — Ambulatory Visit (INDEPENDENT_AMBULATORY_CARE_PROVIDER_SITE_OTHER): Payer: BC Managed Care – PPO | Admitting: Family Medicine

## 2016-07-21 VITALS — BP 108/74 | HR 64 | Temp 97.9°F | Wt 200.6 lb

## 2016-07-21 DIAGNOSIS — E039 Hypothyroidism, unspecified: Secondary | ICD-10-CM

## 2016-07-21 DIAGNOSIS — F331 Major depressive disorder, recurrent, moderate: Secondary | ICD-10-CM

## 2016-07-21 LAB — TSH: TSH: 1.48 u[IU]/mL (ref 0.35–4.50)

## 2016-07-21 MED ORDER — BUPROPION HCL ER (XL) 150 MG PO TB24: 150.0000 mg | ORAL_TABLET | Freq: Every day | ORAL | 5 refills | Status: DC

## 2016-07-21 NOTE — Progress Notes (Signed)
Pre visit review using our clinic review tool, if applicable. No additional management support is needed unless otherwise documented below in the visit note. 

## 2016-07-21 NOTE — Patient Instructions (Addendum)
Recheck 6 weeks  TSH check before you leave  Taking the medicine as directed and not missing any doses is one of the best things you can do to treat your depression.  Here are some things to keep in mind:  1) Side effects (stomach upset, some increased anxiety) may happen before you notice a benefit. wellbutrin tends to cause some more anxiety than other meds These side effects typically go away over time. 2) Changes to your dose of medicine or a change in medication all together is sometimes necessary 3) Most people need to be on medication at least 6-12 months 4) Many people will notice an improvement within two weeks but the full effect of the medication can take up to 4-6 weeks 5) Stopping the medication when you start feeling better often results in a return of symptoms 6) If you start having thoughts of hurting yourself or others after starting this medicine, call our office immediately at [phone] or seek care through 911.

## 2016-07-21 NOTE — Progress Notes (Signed)
Subjective:  Eric Vazquez is a 55 y.o. year old very pleasant male patient who presents for/with See problem oriented charting ROS- no chest pain or SOB, has depressed mood and anhedonia but no SI/HI.see any ROS included in HPI as well.   Past Medical History-  Patient Active Problem List   Diagnosis Date Noted  . Vitamin D deficiency 12/10/2015    Priority: Medium  . Depression 09/10/2015    Priority: Medium  . Pernicious anemia 03/26/2015    Priority: Medium  . Hypothyroidism 12/20/2010    Priority: Medium  . GERD (gastroesophageal reflux disease)     Priority: Low  . Thrombocytopenia ([place]) 03/30/2015    Priority: Low  . 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 Actinic keratosis 03/26/2015    Priority: Low  . Left hip flexor tightness 03/17/2016  . Nonallopathic lesion of lumbosacral region 03/17/2016  . Nonallopathic lesion of thoracic region 03/17/2016  . Nonallopathic lesion of sacral region 03/17/2016    Medications- reviewed and updated Current Outpatient Prescriptions  Medication Sig Dispense Refill  . Cyanocobalamin 1000 MCG/ML KIT Inject 1,000 mcg as directed.    . Diclofenac Sodium 2 % SOLN Apply 1 pump twice daily. 179 g 3  . folic acid (FOLVITE) 1 MG tablet Take 1 mg by mouth daily.    . pantoprazole (PROTONIX) 40 MG tablet TAKE 1 TABLET(40 MG) BY MOUTH DAILY 90 tablet 3  . SYNTHROID 200 MCG tablet TAKE 1 TABLET BY MOUTH EVERY DAY 30 tablet 5  . buPROPion (WELLBUTRIN XL) 150 MG 24 hr tablet Take 1 tablet (150 mg total) by mouth daily. 31 tablet 5   No current facility-administered medications for this visit.     Objective: BP 108/74 (BP Location: Left Arm, Patient Position: Sitting, Cuff Size: Large)   Pulse 64   Temp 97.9 F (36.6 C) (Oral)   Wt 200 lb 9.6 oz (91 kg)   SpO2 99%   BMI 26.47 kg/m  Gen: NAD, resting comfortably Psych: does not appear to be in depressed mood  Assessment/Plan:  Depression S: PHQ9 today of 11 with No SI/HI.   Saw Dr. Glennon [place] and has not really noted improvement x 5 visits. Has bene on lexapro in past- did not like low libido. Energy level was low with 63m. He states most of his issues started after his issues with pernicious anemia. Had declined meds in past. Has had a really hard time with a family issue and has not talked to son in months despite trying to reach his son A/P: givne no improvement with counseling, we opted to add wellbutrin 1540mXL with return/warning precautions and 6 week follow up planned. He was also worried that could be thyroid related issues and updated TSH which was normal consider SNRI as may have less sexual SE as well if Wellbutrin not effective.   Orders Placed This Encounter  Procedures  . TSH    [place]    Meds ordered this encounter  Medications  . buPROPion (WELLBUTRIN XL) 150 MG 24 hr tablet    Sig: Take 1 tablet (150 mg total) by mouth daily.    Dispense:  31 tablet    Refill:  5   The duration of face-to-face time during this visit was greater than 20 minutes. Greater than 50% of this time was spent in counseling about depression and treatment options as well as potential SE    Return precautions advised.  StGarret ReddishMD

## 2016-07-22 NOTE — Assessment & Plan Note (Addendum)
S: PHQ9 today of 11 with No SI/HI.  Saw Dr. Glennon Hamilton and has not really noted improvement x 5 visits. Has bene on lexapro in past- did not like low libido. Energy level was low with 20mg . He states most of his issues started after his issues with pernicious anemia. Had declined meds in past. Has had a really hard time with a family issue and has not talked to son in months despite trying to reach his son A/P: givne no improvement with counseling, we opted to add wellbutrin 150mg  XL with return/warning precautions and 6 week follow up planned. He was also worried that could be thyroid related issues and updated TSH which was normal

## 2016-08-03 NOTE — Progress Notes (Signed)
[name] [address] [place], [place] [address] Phone: [phone] Subjective:    I'm seeing this patient by the request  of:  [name], MD  CC: Back pain follow-up  [id number]  Anna Guldner is a 55 y.o. male coming in with complaint of low back pain. Patient was found to have more of a sacroiliac dysfunction and poor posture. Has been working on the tight hip flexors. Patient has been trying to work on Personal assistant. Patient states that he has been working on to attempt to lose weight. Patient states Overall patient is doing relatively well. Overall I is doing very well. Patient denies any significant pain. Is working on a regular basis. Feels like this is soft as significant. Continues to have some type hamstring but seemed to be making differences an improvement. Denies any new symptoms.      Past Medical History:  Diagnosis Date  . Allergy   . Chicken pox   . GERD (gastroesophageal reflux disease)   . Gilbert disease   . Hearing loss    L ear, has hearing aid. high speed suction over years  . Hemorrhoids   . Hypothyroidism   . Pernicious anemia   . Thrombocytopenia ([place])   . Ulnar neuropathy at elbow of right upper extremity    Past Surgical History:  Procedure Laterality Date  . SEPTOPLASTY  [id number]; 1993   with turbinate reduction, atroplasty  . SHOULDER SURGERY Right 2012   labral tear  . TONSILLECTOMY AND ADENOIDECTOMY  1970   Social History   Social History  . Marital status: Married    Spouse name: N/A  . Number of children: 4  . Years of education: N/A   Occupational History  . dentist    Social History Main Topics  . Smoking status: Never Smoker  . Smokeless tobacco: Never Used  . Alcohol use Yes     Comment: 1 weekly  . Drug use: No  . Sexual activity: Yes   Other Topics Concern  . None   Social History Narrative   Married. [name].      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 He lives with his wife & 3 sons (twins [name] and [place] at [place], older [place] at [employer], 1 son is grown [name]- to be married 2017). He is from [place], lived in [place] before relocating to [place].      Hobbies: enjoys yardwork, hiking, back packing when able   Exercises at home- walking, working out, biking.    Allergies  Allergen Reactions  . Penicillins    Family History  Problem Relation Age of Onset  . Pernicious anemia Mother   . Alcohol abuse Maternal Grandmother   . Diabetes Maternal Grandmother   . Pernicious anemia Maternal Grandfather   . Alcohol abuse Maternal Grandfather     Past medical history, social, surgical and family history all reviewed in electronic medical record.  No pertanent information unless stated regarding to the chief complaint.   Review of Systems: No headache, visual changes, nausea, vomiting, diarrhea, constipation, dizziness, abdominal pain, skin rash, fevers, chills, night sweats, weight loss, swollen lymph nodes, body aches, joint swelling, muscle aches, chest pain, shortness of breath, mood changes.   Objective  Blood pressure 112/72, pulse 72, weight 198 lb (89.8 kg), SpO2 96 %.  General: No apparent distress alert and oriented x3 mood and affect normal, dressed appropriately.  HEENT: Pupils equal, extraocular movements intact  Respiratory: Patient's speak in full sentences and does not appear short of breath  Cardiovascular: No lower extremity edema, non tender, no erythema  Skin: Warm dry intact with no signs of infection or rash on extremities or on axial skeleton.  Abdomen: Soft nontender  Neuro: Cranial nerves II through XII are intact, neurovascularly intact in all extremities with 2+ DTRs and 2+ pulses.  Lymph: No lymphadenopathy of posterior or anterior cervical chain or axillae bilaterally.  Gait normal with good balance and coordination.  MSK:  Non tender with full range of motion and good stability and symmetric strength and tone of  shoulders, elbows, wrist, hip, knee and ankles bilaterally.  Back Exam:  Inspection: Unremarkable  Motion: Flexion 40 deg, Extension 25 deg, Side Bending to 35 deg bilaterally,  Rotation to 35 deg bilaterally  SLR laying: Negative  XSLR laying: Negative  Palpable tenderness: No pain overthe sacroiliac joint but mild pain over the hamstrings still. Mild pain over the paraspinal musculature of the lumbar spine. FABER: negative. Sensory change: Gross sensation intact to all lumbar and sacral dermatomes.  Reflexes: 2+ at both patellar tendons, 2+ at achilles tendons, Babinski's downgoing.  Strength at foot  Plantar-flexion: 5/5 Dorsi-flexion: 5/5 Eversion: 5/5 Inversion: 5/5  Leg strength  Quad: 5/5 Hamstring: 5/5 Hip flexor: 5/5 Hip abductors: 5/5  Gait unremarkable.  Osteopathic findings C2 flexed rotated and side bent right T5 extended rotated and side bent right L2 flexed rotated and side bent left L5 flexed rotated and side bent right Sacrum right on right    Impression and Recommendations:     This case required medical decision making of moderate complexity.      Note: This dictation was prepared with Dragon dictation along with smaller phrase technology. Any transcriptional errors that result from this process are unintentional.

## 2016-08-04 ENCOUNTER — Other Ambulatory Visit: Payer: Self-pay | Admitting: Family Medicine

## 2016-08-04 ENCOUNTER — Ambulatory Visit (INDEPENDENT_AMBULATORY_CARE_PROVIDER_SITE_OTHER): Payer: BC Managed Care – PPO | Admitting: Family Medicine

## 2016-08-04 ENCOUNTER — Encounter: Payer: Self-pay | Admitting: Family Medicine

## 2016-08-04 VITALS — BP 112/72 | HR 72 | Wt 198.0 lb

## 2016-08-04 DIAGNOSIS — M999 Biomechanical lesion, unspecified: Secondary | ICD-10-CM

## 2016-08-04 DIAGNOSIS — M24552 Contracture, left hip: Secondary | ICD-10-CM

## 2016-08-04 MED ORDER — CYANOCOBALAMIN 1000 MCG/ML IJ KIT: 1000.0000 ug | PACK | INTRAMUSCULAR | 3 refills | Status: DC

## 2016-08-04 NOTE — Assessment & Plan Note (Signed)
Decision today to treat with OMT was based on Physical Exam  After verbal consent patient was treated with HVLA, ME, techniques in cervical, thoracic, lumbar and sacral areas  Patient tolerated the procedure well with improvement in symptoms  Patient given exercises, stretches and lifestyle modifications  See medications in patient instructions if given  Patient will follow up in 8 weeks

## 2016-08-04 NOTE — Patient Instructions (Signed)
Good to see you  Ice is your friend I am proud of you  Y-T-A exercises can help Askling exercises for hamstring See me again in 2 months

## 2016-08-04 NOTE — Assessment & Plan Note (Signed)
Improvement.  No numbness, making progress, improvement in core strength New askling exercises.  RTC in 4 weeks.

## 2016-09-14 NOTE — Progress Notes (Signed)
[facility] [name], [name] [address] Phone: [phone] Subjective:    I'm seeing this patient by the request  of:  Eric Reddish, MD  CC: Back pain follow-up  [id number]  Eric Vazquez is a 55 y.o. male coming in with complaint of low back pain. Patient was found to have more of a sacroiliac dysfunction and poor posture. Has been working on the tight hip flexors. States that the ergonomics as well as weight loss has been the biggest difference. Doing the exercises most the time. Patient states that he has not felt this good in years.      Past Medical History:  Diagnosis Date  . Allergy   . Chicken pox   . GERD (gastroesophageal reflux disease)   . Gilbert disease   . Hearing loss    L ear, has hearing aid. high speed suction over years  . Hemorrhoids   . Hypothyroidism   . Pernicious anemia   . Thrombocytopenia ([place])   . Ulnar neuropathy at elbow of right upper extremity    Past Surgical History:  Procedure Laterality Date  . SEPTOPLASTY  [id number]; 1993   with turbinate reduction, atroplasty  . SHOULDER SURGERY Right 2012   labral tear  . TONSILLECTOMY AND ADENOIDECTOMY  1970   Social History   Social History  . Marital status: Married    Spouse name: N/A  . Number of children: 4  . Years of education: N/A   Occupational History  . dentist    Social History Main Topics  . Smoking status: Never Smoker  . Smokeless tobacco: Never Used  . Alcohol use Yes     Comment: 1 weekly  . Drug use: No  . Sexual activity: Yes   Other Topics Concern  . None   Social History Narrative   Married. Eric Vazquez.      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 He lives with his wife & 3 sons (twins Eric Vazquez and Eric Vazquez at [place], older [name] at [place], 1 son is grown Eric Vazquez- to be married 2017). He is from [place], lived in [place] before relocating to [place].      Hobbies: enjoys yardwork, hiking, back packing when able   Exercises at home- walking, working out, biking.    Allergies  Allergen Reactions  . Penicillins    Family History  Problem Relation Age of Onset  . Pernicious anemia Mother   . Alcohol abuse Maternal Grandmother   . Diabetes Maternal Grandmother   . Pernicious anemia Maternal Grandfather   . Alcohol abuse Maternal Grandfather     Past medical history, social, surgical and family history all reviewed in electronic medical record.  No pertanent information unless stated regarding to the chief complaint.   Review of Systems: No headache, visual changes, nausea, vomiting, diarrhea, constipation, dizziness, abdominal pain, skin rash, fevers, chills, night sweats, swollen lymph nodes, body aches, joint swelling, muscle aches, chest pain, shortness of breath, mood changes.   Objective  Blood pressure 128/80, pulse 83, height 6\' 1"  (1.854 m), weight 197 lb (89.4 kg), SpO2 97 %.  Systems examined below as of 09/15/16 General: NAD A&O x3 mood, affect normal Has lost weight since previous exam HEENT: Pupils equal, extraocular movements intact no nystagmus Respiratory: not short of breath at rest or with speaking Cardiovascular: No lower extremity edema, non tender Skin: Warm dry intact with no signs of infection or rash on extremities or on axial  skeleton. Abdomen: Soft nontender, no masses Neuro: Cranial nerves  intact, neurovascularly intact in all extremities with 2+ DTRs and 2+ pulses. Lymph: No lymphadenopathy appreciated today  Gait normal with good balance and coordination.  MSK: Non tender with full range of motion and good stability and symmetric strength and tone of shoulders, elbows, wrist,  knee hips and ankles bilaterally.   Back Exam:  Inspection: Unremarkable  Motion: Flexion 45 deg, Extension 25 deg, Side Bending to 45 deg bilaterally,  Rotation to 45 deg bilaterally  SLR laying: Negative  XSLR laying: Negative  Palpable tenderness: Continued mild tightness of the  hamstring. FABER: negative. Sensory change: Gross sensation intact to all lumbar and sacral dermatomes.  Reflexes: 2+ at both patellar tendons, 2+ at achilles tendons, Babinski's downgoing.  Strength at foot  Plantar-flexion: 5/5 Dorsi-flexion: 5/5 Eversion: 5/5 Inversion: 5/5  Leg strength  Quad: 5/5 Hamstring: 5/5 Hip flexor: 5/5 Hip abductors: 5/5  Gait unremarkable.  Osteopathic findings C2 flexed rotated and side bent right T7 extended rotated and side bent right L1 flexed rotated and side bent left L5 flexed rotated and side bent right Sacrum right on right    Impression and Recommendations:     This case required medical decision making of moderate complexity.      Note: This dictation was prepared with Dragon dictation along with smaller phrase technology. Any transcriptional errors that result from this process are unintentional.

## 2016-09-15 ENCOUNTER — Ambulatory Visit (INDEPENDENT_AMBULATORY_CARE_PROVIDER_SITE_OTHER): Payer: BC Managed Care – PPO | Admitting: Family Medicine

## 2016-09-15 ENCOUNTER — Encounter: Payer: Self-pay | Admitting: Family Medicine

## 2016-09-15 VITALS — BP 128/80 | HR 83 | Ht 73.0 in | Wt 197.0 lb

## 2016-09-15 DIAGNOSIS — M24552 Contracture, left hip: Secondary | ICD-10-CM | POA: Diagnosis not present

## 2016-09-15 DIAGNOSIS — M999 Biomechanical lesion, unspecified: Secondary | ICD-10-CM

## 2016-09-15 NOTE — Assessment & Plan Note (Signed)
Decision today to treat with OMT was based on Physical Exam  After verbal consent patient was treated with HVLA, ME, techniques in cervical, thoracic, lumbar and sacral areas  Patient tolerated the procedure well with improvement in symptoms  Patient given exercises, stretches and lifestyle modifications  See medications in patient instructions if given  Patient will follow up as needed

## 2016-09-15 NOTE — Patient Instructions (Signed)
Verbal instructions given

## 2016-09-15 NOTE — Assessment & Plan Note (Signed)
Doing markedly well this time. Encourage patient to continue home exercises in the icing protocol. No significant change in management except focusing on the core and hip abductor strengthening. Patient will follow-up with me again more on an as-needed basis.

## 2016-10-20 ENCOUNTER — Other Ambulatory Visit: Payer: Self-pay | Admitting: Family Medicine

## 2016-10-22 ENCOUNTER — Other Ambulatory Visit: Payer: Self-pay | Admitting: Family Medicine

## 2016-11-03 ENCOUNTER — Ambulatory Visit (INDEPENDENT_AMBULATORY_CARE_PROVIDER_SITE_OTHER): Payer: BC Managed Care – PPO | Admitting: Family Medicine

## 2016-11-03 ENCOUNTER — Encounter: Payer: Self-pay | Admitting: Family Medicine

## 2016-11-03 VITALS — BP 118/66 | HR 64 | Temp 97.9°F | Ht 73.0 in | Wt 201.0 lb

## 2016-11-03 DIAGNOSIS — F329 Major depressive disorder, single episode, unspecified: Secondary | ICD-10-CM | POA: Diagnosis not present

## 2016-11-03 DIAGNOSIS — E039 Hypothyroidism, unspecified: Secondary | ICD-10-CM | POA: Diagnosis not present

## 2016-11-03 DIAGNOSIS — F32A Depression, unspecified: Secondary | ICD-10-CM

## 2016-11-03 DIAGNOSIS — R002 Palpitations: Secondary | ICD-10-CM

## 2016-11-03 DIAGNOSIS — R000 Tachycardia, unspecified: Secondary | ICD-10-CM | POA: Diagnosis not present

## 2016-11-03 LAB — CBC WITH DIFFERENTIAL/PLATELET
BASOS ABS: 0 10*3/uL (ref 0.0–0.1)
Basophils Relative: 0.2 % (ref 0.0–3.0)
EOS ABS: 0.1 10*3/uL (ref 0.0–0.7)
Eosinophils Relative: 3.2 % (ref 0.0–5.0)
HEMATOCRIT: 42.3 % (ref 39.0–52.0)
HEMOGLOBIN: 14.4 g/dL (ref 13.0–17.0)
LYMPHS PCT: 29.3 % (ref 12.0–46.0)
Lymphs Abs: 1.3 10*3/uL (ref 0.7–4.0)
MCHC: 34 g/dL (ref 30.0–36.0)
MCV: 88.8 fl (ref 78.0–100.0)
Monocytes Absolute: 0.5 10*3/uL (ref 0.1–1.0)
Monocytes Relative: 10.8 % (ref 3.0–12.0)
NEUTROS ABS: 2.5 10*3/uL (ref 1.4–7.7)
Neutrophils Relative %: 56.5 % (ref 43.0–77.0)
PLATELETS: 129 10*3/uL — AB (ref 150.0–400.0)
RBC: 4.76 Mil/uL (ref 4.22–5.81)
RDW: 12.6 % (ref 11.5–15.5)
WBC: 4.5 10*3/uL (ref 4.0–10.5)

## 2016-11-03 LAB — TSH: TSH: 0.09 u[IU]/mL — ABNORMAL LOW (ref 0.35–4.50)

## 2016-11-03 LAB — BASIC METABOLIC PANEL
BUN: 16 mg/dL (ref 6–23)
CHLORIDE: 104 meq/L (ref 96–112)
CO2: 32 meq/L (ref 19–32)
CREATININE: 1.07 mg/dL (ref 0.40–1.50)
Calcium: 8.9 mg/dL (ref 8.4–10.5)
GFR: 76.35 mL/min (ref >60.00)
GLUCOSE: 89 mg/dL (ref 70–99)
POTASSIUM: 4.3 meq/L (ref 3.5–5.1)
Sodium: 141 mEq/L (ref 135–145)

## 2016-11-03 MED ORDER — SYNTHROID 175 MCG PO TABS: 175.0000 ug | ORAL_TABLET | Freq: Every day | ORAL | 5 refills | Status: DC

## 2016-11-03 NOTE — Patient Instructions (Addendum)
Could be the wellbutrin  Would try to avoid any caffeine after noon  Check labs including thyroid to make sure not contributing  Get heart monitor- asked them to try to do on Friday with 72 hour monitoring with hopes to pick up one of the events. Please journal if you have an episode while wearing the monitor. Main thing I want to rule out is something like atrial fibrillation though do not have a strong suspicion

## 2016-11-03 NOTE — Progress Notes (Addendum)
Subjective:  Eric Vazquez is a 56 y.o. year old very pleasant male patient who presents for/with See problem oriented charting ROS- no fever, chills. No  Chest pain, shortness of breath. No diaphoresis  Past Medical History-  Patient Active Problem List   Diagnosis Date Noted  . Vitamin D deficiency 12/10/2015    Priority: Medium  . Depression 09/10/2015    Priority: Medium  . Pernicious anemia 03/26/2015    Priority: Medium  . Hypothyroidism 12/20/2010    Priority: Medium  . GERD (gastroesophageal reflux disease)     Priority: Low  . Thrombocytopenia ([place]) 03/30/2015    Priority: Low  . 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 Actinic keratosis 03/26/2015    Priority: Low  . Left hip flexor tightness 03/17/2016  . Nonallopathic lesion of lumbosacral region 03/17/2016  . Nonallopathic lesion of thoracic region 03/17/2016  . Nonallopathic lesion of sacral region 03/17/2016    Medications- reviewed and updated Current Outpatient Prescriptions  Medication Sig Dispense Refill  . buPROPion (WELLBUTRIN XL) 150 MG 24 hr tablet Take 1 tablet (150 mg total) by mouth daily. 31 tablet 5  . Cyanocobalamin 1000 MCG/ML KIT Inject 1,000 mcg as directed every 14 (fourteen) days. 7 kit 3  . Diclofenac Sodium 2 % SOLN Apply 1 pump twice daily. 161 g 3  . folic acid (FOLVITE) 1 MG tablet Take 1 mg by mouth daily.    . pantoprazole (PROTONIX) 40 MG tablet TAKE 1 TABLET(40 MG) BY MOUTH DAILY 90 tablet 1  . SYNTHROID 200 MCG tablet TAKE 1 TABLET BY MOUTH EVERY DAY 30 tablet 5   No current facility-administered medications for this visit.     Objective: BP 118/66 (BP Location: Left Arm, Patient Position: Sitting, Cuff Size: Large)   Pulse 64   Temp 97.9 F (36.6 C) (Oral)   Ht 6' 1" (1.854 m)   Wt 201 lb (91.2 kg)   SpO2 97%   BMI 26.52 kg/m  Gen: NAD, resting comfortably No thyromegaly CV: RRR no murmurs rubs or gallops Lungs: CTAB no crackles, wheeze, rhonchi  Ext: no  edema Skin: warm, dry, no rash  EKG: Rate 62, normal axis, normal intervals, no hypertrophy, no st or t wave changes. Normal ekg- no prior to compare.   Assessment/Plan:    Tachycardia and palpitations  S: Multiple nights he would wake up and would feel heart pounding. Felt like it was going about double what his HR would normally be. Then 3-5 minutes would go back to normal and could go back to sleep. No chest pain, shortness of breath, neck pain, arm pain. Not sweating with it.   Caffeine in AM, occasional coke once a week but not at frequency of these events 3-4 days a week. Does not feel like he is stressed at work. Thinks may have felt it at work as well but is active so did not concern him. Going on for 4-5 weeks  Lab Results  Component Value Date   TSH 1.48 07/21/2016   A/P: Could be the wellbutrin- wellbutrin 10% or so with tachycardia  Would try to avoid any caffeine after noon  Check labs including thyroid to make sure not contributing  Get heart monitor- asked them to try to do on Friday with 72 hour monitoring with hopes to pick up one of the events. Please journal if you have an episode while wearing the monitor. 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  A/P: continue current medicines   Hypothyroidism Addendum 11/06/16: Lab Results  Component Value Date   TSH 0.09 (L) 11/03/2016  Discussed low TSH with Dr. KNoah Charon We will reduce synthroid to 175 mcg and follow in 6 weeks with repeat tsh which I ordered today. Will not pursue holter monitor unless has palpitations after tsh normalized.   Orders Placed This Encounter  Procedures  . CBC with Differential/Platelet  . Basic metabolic panel    [facility]  . TSH    [facility]  . Holter monitor - 72 hour    Standing Status:   Future    Standing Expiration Date:   11/03/2026     Scheduling Instructions:     Would be great if could be set up on Friday. Works as dPharmacist, communityduring the week.  . EKG 12-Lead    Order Specific Question:   Where should this test be performed    Answer:   Other   Return precautions advised.  SGarret Reddish MD

## 2016-11-03 NOTE — Assessment & Plan Note (Signed)
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 PHQ9 of 0.  A/P: continue current medicines

## 2016-11-03 NOTE — Progress Notes (Signed)
Pre visit review using our clinic review tool, if applicable. No additional management support is needed unless otherwise documented below in the visit note. 

## 2016-11-03 NOTE — Addendum Note (Signed)
Addended by: Marin Olp on: 11/03/2016 05:39 PM   Modules accepted: Orders

## 2016-11-06 NOTE — Assessment & Plan Note (Addendum)
Addendum 11/06/16: Lab Results  Component Value Date   TSH 0.09 (L) 11/03/2016  Discussed low TSH with Dr. Noah Charon. We will reduce synthroid to 175 mcg and follow in 6 weeks with repeat tsh which I ordered today. Will not pursue holter monitor unless has palpitations after tsh normalized.

## 2016-11-06 NOTE — Addendum Note (Signed)
Addended by: Marin Olp on: 11/06/2016 09:55 AM   Modules accepted: Orders

## 2016-12-15 ENCOUNTER — Other Ambulatory Visit (INDEPENDENT_AMBULATORY_CARE_PROVIDER_SITE_OTHER): Payer: BC Managed Care – PPO

## 2016-12-15 DIAGNOSIS — E039 Hypothyroidism, unspecified: Secondary | ICD-10-CM

## 2016-12-15 LAB — TSH: TSH: 1.08 u[IU]/mL (ref 0.35–4.50)

## 2016-12-18 ENCOUNTER — Telehealth: Payer: Self-pay | Admitting: Family Medicine

## 2016-12-18 DIAGNOSIS — R002 Palpitations: Secondary | ICD-10-CM

## 2016-12-18 NOTE — Telephone Encounter (Signed)
Still having palpitations despite TSH normalizing. Basically everynight. Will get 48 hour monitor.

## 2017-01-04 ENCOUNTER — Ambulatory Visit (INDEPENDENT_AMBULATORY_CARE_PROVIDER_SITE_OTHER): Payer: BC Managed Care – PPO

## 2017-01-04 DIAGNOSIS — R002 Palpitations: Secondary | ICD-10-CM | POA: Diagnosis not present

## 2017-01-12 ENCOUNTER — Encounter: Payer: Self-pay | Admitting: Family Medicine

## 2017-01-25 ENCOUNTER — Other Ambulatory Visit: Payer: Self-pay | Admitting: Family Medicine

## 2017-03-19 ENCOUNTER — Other Ambulatory Visit: Payer: Self-pay | Admitting: Family Medicine

## 2017-03-19 MED ORDER — CYANOCOBALAMIN 1000 MCG/ML IJ SOLN: 1000.0000 ug | INTRAMUSCULAR | 1 refills | Status: DC

## 2017-04-06 ENCOUNTER — Encounter: Payer: Self-pay | Admitting: Family Medicine

## 2017-04-06 ENCOUNTER — Ambulatory Visit (INDEPENDENT_AMBULATORY_CARE_PROVIDER_SITE_OTHER): Payer: BC Managed Care – PPO | Admitting: Family Medicine

## 2017-04-06 DIAGNOSIS — F329 Major depressive disorder, single episode, unspecified: Secondary | ICD-10-CM | POA: Diagnosis not present

## 2017-04-06 DIAGNOSIS — E039 Hypothyroidism, unspecified: Secondary | ICD-10-CM

## 2017-04-06 DIAGNOSIS — F32A Depression, unspecified: Secondary | ICD-10-CM

## 2017-04-06 MED ORDER — PANTOPRAZOLE SODIUM 40 MG PO TBEC: DELAYED_RELEASE_TABLET | ORAL | 3 refills | Status: DC

## 2017-04-06 MED ORDER — BUPROPION HCL ER (XL) 150 MG PO TB24: ORAL_TABLET | ORAL | 3 refills | Status: DC

## 2017-04-06 MED ORDER — SYNTHROID 175 MCG PO TABS: 175.0000 ug | ORAL_TABLET | Freq: Every day | ORAL | 3 refills | Status: DC

## 2017-04-06 NOTE — Assessment & Plan Note (Signed)
S: reasonable control on wellbutrin XL. DID not think visit with Dr. Glennon Hamilton was that helpful. Main issue for him is continued stress of not having relationship with oldest son after his wedding- he deeply desires to reestablish that relationship.  A/P: increasing medication will not fix relationship with son- patient plans to continue in prayer about this subject. We opted not to pursue counseling again either

## 2017-04-06 NOTE — Patient Instructions (Signed)
Refilled your meds- should all be good for a year  I think sparing ibuprofen or voltaren gel is fine for the thumb. Resting it would probably help the most but not really possible. Can check in with Dr. Tamala Julian or Dr. Paulla Fore to consider injections or potential other exercises/stretches that may help- if gets more consistent.

## 2017-04-06 NOTE — Progress Notes (Signed)
Subjective:  Eric Vazquez is a 56 y.o. year old very pleasant male patient who presents for/with See problem oriented charting ROS- No chest pain or shortness of breath. No headache or blurry vision. Occasional palpitations- less frequent with more hydration, less caffeine   Past Medical History-  Patient Active Problem List   Diagnosis Date Noted  . Vitamin D deficiency 12/10/2015    Priority: Medium  . Depression, controlled 09/10/2015    Priority: Medium  . Pernicious anemia 03/26/2015    Priority: Medium  . Hypothyroidism 12/20/2010    Priority: Medium  . GERD (gastroesophageal reflux disease)     Priority: Low  . Thrombocytopenia ([place]) 03/30/2015    Priority: Low  . 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 Actinic keratosis 03/26/2015    Priority: Low  . Left hip flexor tightness 03/17/2016  . Nonallopathic lesion of lumbosacral region 03/17/2016  . Nonallopathic lesion of thoracic region 03/17/2016  . Nonallopathic lesion of sacral region 03/17/2016    Medications- reviewed and updated Current Outpatient Prescriptions  Medication Sig Dispense Refill  . buPROPion (WELLBUTRIN XL) 150 MG 24 hr tablet TAKE 1 TABLET(150 MG) BY MOUTH DAILY 91 tablet 3  . cyanocobalamin (,VITAMIN B-12,) 1000 MCG/ML injection Inject 1 mL (1,000 mcg total) into the muscle every 14 (fourteen) days. 25 mL 1  . Diclofenac Sodium 2 % SOLN Apply 1 pump twice daily. 962 g 3  . folic acid (FOLVITE) 1 MG tablet Take 1 mg by mouth daily.    . pantoprazole (PROTONIX) 40 MG tablet TAKE 1 TABLET(40 MG) BY MOUTH DAILY 91 tablet 3  . SYNTHROID 175 MCG tablet Take 1 tablet (175 mcg total) by mouth daily before breakfast. 91 tablet 3  . Syringe/Needle, Disp, (SYRINGE 3CC/25GX1") 25G X 1" 3 ML MISC 3 mLs by Does not apply route every 14 (fourteen) days.      No current facility-administered medications for this visit.     Objective: BP 116/86 (BP Location: Left Arm, Patient Position: Sitting, Cuff  Size: Large)   Pulse 76   Temp 98.3 F (36.8 C) (Oral)   Ht 6\' 1"  (1.854 m)   Wt 207 lb 9.6 oz (94.2 kg)   SpO2 98%   BMI 27.39 kg/m  Gen: NAD, resting comfortably CV: RRR no murmurs rubs or gallops Lungs: CTAB no crackles, wheeze, rhonchi Ext: no edema Skin: warm, dry Msk: some pain with palpation right cmc joint and just distal to this joint particularly with thumb flexion  Assessment/Plan:  Depression, controlled S: reasonable control on wellbutrin XL. DID not think visit with Dr. Glennon Hamilton was that helpful. Main issue for him is continued stress of not having relationship with oldest son after his wedding- he deeply desires to reestablish that relationship.  A/P: increasing medication will not fix relationship with son- patient plans to continue in prayer about this subject. We opted not to pursue counseling again either   Hypothyroidism S: needs refill on levothyroxine. Has been doing well and recent labs show well controlled Lab Results  Component Value Date   TSH 1.08 12/15/2016  A/P: his prior palpitations have largely resolved- very sparing- determined to be PVCs. Continue current dose synthroid 175 mcg  Right CMC pain- likely overuse related with work vs. Arthritis CMC joint dominate hand. Can use sparing ibuprofen 800mg  as he has been using- 4x a week max. Sometimes voltaren gel helps and some weeks no issues- mainly related to injections with high level force needed as well  as other procedures- working on adjusting his way of working to reduce overuse. Will monitor BMET intermittently with nsaids  Meds ordered this encounter  Medications  . Syringe/Needle, Disp, (SYRINGE 3CC/25GX1") 25G X 1" 3 ML MISC    Sig: 3 mLs by Does not apply route every 14 (fourteen) days.   Marland Kitchen SYNTHROID 175 MCG tablet    Sig: Take 1 tablet (175 mcg total) by mouth daily before breakfast.    Dispense:  91 tablet    Refill:  3  . pantoprazole (PROTONIX) 40 MG tablet    Sig: TAKE 1 TABLET(40 MG)  BY MOUTH DAILY    Dispense:  91 tablet    Refill:  3  . buPROPion (WELLBUTRIN XL) 150 MG 24 hr tablet    Sig: TAKE 1 TABLET(150 MG) BY MOUTH DAILY    Dispense:  91 tablet    Refill:  3   Return precautions advised.  [name], MD

## 2017-04-06 NOTE — Assessment & Plan Note (Signed)
S: needs refill on levothyroxine. Has been doing well and recent labs show well controlled Lab Results  Component Value Date   TSH 1.08 12/15/2016  A/P: his prior palpitations have largely resolved- very sparing- determined to be PVCs. Continue current dose synthroid 175 mcg

## 2017-10-12 ENCOUNTER — Ambulatory Visit: Payer: BC Managed Care – PPO | Admitting: Family Medicine

## 2017-10-12 ENCOUNTER — Encounter: Payer: Self-pay | Admitting: Family Medicine

## 2017-10-12 VITALS — BP 130/82 | HR 64 | Ht 73.0 in | Wt 216.6 lb

## 2017-10-12 DIAGNOSIS — N4889 Other specified disorders of penis: Secondary | ICD-10-CM

## 2017-10-12 DIAGNOSIS — E039 Hypothyroidism, unspecified: Secondary | ICD-10-CM

## 2017-10-12 LAB — CBC
HCT: 46.9 % (ref 38.5–50.0)
HEMOGLOBIN: 16 g/dL (ref 13.2–17.1)
MCH: 29.2 pg (ref 27.0–33.0)
MCHC: 34.1 g/dL (ref 32.0–36.0)
MCV: 85.6 fL (ref 80.0–100.0)
MPV: 11.6 fL (ref 7.5–12.5)
PLATELETS: 192 10*3/uL (ref 140–400)
RBC: 5.48 10*6/uL (ref 4.20–5.80)
RDW: 12.3 % (ref 11.0–15.0)
WBC: 5.5 10*3/uL (ref 3.8–10.8)

## 2017-10-12 LAB — TSH: TSH: 3.95 m[IU]/L (ref 0.40–4.50)

## 2017-10-12 MED ORDER — "SYRINGE 25G X 1-1/2"" 3 ML MISC": 1 refills | Status: AC

## 2017-10-12 NOTE — Patient Instructions (Signed)
Please stop by lab before you go  We will call you within a week or two about your referral to urology. If you do not hear within 3 weeks, give [place] a call.

## 2017-10-12 NOTE — Assessment & Plan Note (Signed)
S: Lab Results  Component Value Date   TSH 1.08 12/15/2016   On thyroid medication-yes, synthroid 175 mcg A/P: update tsh

## 2017-10-12 NOTE — Progress Notes (Signed)
Subjective:  Eric Vazquez is a 57 y.o. year old very pleasant male patient who presents for/with See problem oriented charting ROS- denies recent palpitations. No chest pain. No shortness of breath.  Does have penis deviation.   Past Medical History-  Patient Active Problem List   Diagnosis Date Noted  . Vitamin D deficiency 12/10/2015    Priority: Medium  . Depression, controlled 09/10/2015    Priority: Medium  . Pernicious anemia 03/26/2015    Priority: Medium  . Hypothyroidism 12/20/2010    Priority: Medium  . GERD (gastroesophageal reflux disease)     Priority: Low  . Thrombocytopenia ([place]) 03/30/2015    Priority: Low  . 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 Actinic keratosis 03/26/2015    Priority: Low  . Left hip flexor tightness 03/17/2016  . Nonallopathic lesion of lumbosacral region 03/17/2016  . Nonallopathic lesion of thoracic region 03/17/2016  . Nonallopathic lesion of sacral region 03/17/2016    Medications- reviewed and updated Current Outpatient Medications  Medication Sig Dispense Refill  . buPROPion (WELLBUTRIN XL) 150 MG 24 hr tablet TAKE 1 TABLET(150 MG) BY MOUTH DAILY 91 tablet 3  . cyanocobalamin (,VITAMIN B-12,) 1000 MCG/ML injection Inject 1 mL (1,000 mcg total) into the muscle every 14 (fourteen) days. 25 mL 1  . folic acid (FOLVITE) 1 MG tablet Take 1 mg by mouth daily.    . pantoprazole (PROTONIX) 40 MG tablet TAKE 1 TABLET(40 MG) BY MOUTH DAILY 91 tablet 3  . SYNTHROID 175 MCG tablet Take 1 tablet (175 mcg total) by mouth daily before breakfast. 91 tablet 3  . Syringe/Needle, Disp, (SYRINGE 3CC/25GX1-1/2") 25G X 1-1/2" 3 ML MISC Use to administer B12 injection every 14 days or as directed. 100 each 1   No current facility-administered medications for this visit.     Objective: BP 130/82 (BP Location: Left Arm, Patient Position: Sitting, Cuff Size: Normal)   Pulse 64   Ht 6\' 1"  (1.854 m)   Wt 216 lb 9.6 oz (98.2 kg)   SpO2 98%    BMI 28.58 kg/m  Gen: NAD, resting comfortably No thyromegaly CV: RRR no murmurs rubs or gallops Lungs: CTAB no crackles, wheeze, rhonchi Abdomen: soft/nontender/nondistended/normal bowel sounds. No rebound or guarding.  Ext: no edema Skin: warm, dry  Assessment/Plan:  Other issues:  1. GI bug end of December- got IV fluids at urgent care- doing better. No diarrhea in well over 24 hours 2. 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 started suddenly on the 15th of December after sex- did not have pain with sex. Erection itself is not painful but can have pain after sex. No issues with getting erection A/P: sudden change in direction suggests to me possible "broken penis" but odd did not have pain. Could be peyronies but odd to be at the base- did not examine today- will get urolgoy opinoin.   Hypothyroidism S: Lab Results  Component Value Date   TSH 1.08 12/15/2016   On thyroid medication-yes, synthroid 175 mcg A/P: update tsh   Future Appointments  Date Time Provider [place]  11/23/2017 11:00 AM Marin Olp, MD [facility]   Return for CPE.  Orders Placed This Encounter  Procedures  . CBC  . TSH  . Ambulatory referral to Urology    Referral Priority:   Routine    Referral Type:   Consultation    Referral Reason:   Specialty Services Required    Requested Specialty:   Urology    Number of Visits Requested:   1   Longer needles for b12 injection buttocks Meds ordered this encounter  Medications  . Syringe/Needle, Disp, (SYRINGE 3CC/25GX1-1/2") 25G X 1-1/2" 3 ML MISC    Sig: Use to administer B12 injection every 14 days or as directed.    Dispense:  100 each    Refill:  1    Return  precautions advised.  [name], MD

## 2017-11-23 ENCOUNTER — Encounter: Payer: Self-pay | Admitting: Family Medicine

## 2017-11-23 ENCOUNTER — Ambulatory Visit (INDEPENDENT_AMBULATORY_CARE_PROVIDER_SITE_OTHER): Payer: BC Managed Care – PPO | Admitting: Family Medicine

## 2017-11-23 VITALS — BP 112/74 | HR 65 | Temp 97.8°F | Ht 73.0 in | Wt 218.2 lb

## 2017-11-23 DIAGNOSIS — D510 Vitamin B12 deficiency anemia due to intrinsic factor deficiency: Secondary | ICD-10-CM

## 2017-11-23 DIAGNOSIS — R0982 Postnasal drip: Secondary | ICD-10-CM

## 2017-11-23 DIAGNOSIS — E039 Hypothyroidism, unspecified: Secondary | ICD-10-CM

## 2017-11-23 DIAGNOSIS — Z9889 Other specified postprocedural states: Secondary | ICD-10-CM | POA: Diagnosis not present

## 2017-11-23 DIAGNOSIS — E559 Vitamin D deficiency, unspecified: Secondary | ICD-10-CM | POA: Diagnosis not present

## 2017-11-23 DIAGNOSIS — Z0000 Encounter for general adult medical examination without abnormal findings: Secondary | ICD-10-CM

## 2017-11-23 DIAGNOSIS — F32A Depression, unspecified: Secondary | ICD-10-CM

## 2017-11-23 DIAGNOSIS — F329 Major depressive disorder, single episode, unspecified: Secondary | ICD-10-CM

## 2017-11-23 MED ORDER — CYANOCOBALAMIN 1000 MCG/ML IJ SOLN: 1000.0000 ug | INTRAMUSCULAR | 3 refills | Status: DC

## 2017-11-23 MED ORDER — FOLIC ACID 1 MG PO TABS: 1.0000 mg | ORAL_TABLET | Freq: Every day | ORAL | 3 refills | Status: DC

## 2017-11-23 MED ORDER — PANTOPRAZOLE SODIUM 40 MG PO TBEC: DELAYED_RELEASE_TABLET | ORAL | 3 refills | Status: DC

## 2017-11-23 MED ORDER — BUPROPION HCL ER (XL) 150 MG PO TB24: ORAL_TABLET | ORAL | 3 refills | Status: DC

## 2017-11-23 MED ORDER — SYNTHROID 175 MCG PO TABS: 175.0000 ug | ORAL_TABLET | Freq: Every day | ORAL | 3 refills | Status: DC

## 2017-11-23 NOTE — Progress Notes (Signed)
Phone: [phone]  Subjective:  Patient presents today for their annual physical. Chief complaint-noted.   See problem oriented charting- ROS- full  review of systems was completed and negative except for: congestion, post nasal drip, curvature of penis  The following were reviewed and entered/updated in epic: Past Medical History:  Diagnosis Date  . Allergy   . Chicken pox   . GERD (gastroesophageal reflux disease)   . Gilbert disease   . Hearing loss    L ear, has hearing aid. high speed suction over years  . Hemorrhoids   . Hypothyroidism   . Pernicious anemia   . Thrombocytopenia ([place])   . Ulnar neuropathy at elbow of right upper extremity    Patient Active Problem List   Diagnosis Date Noted  . Vitamin D deficiency 12/10/2015    Priority: Medium  . Depression, controlled 09/10/2015    Priority: Medium  . Pernicious anemia 03/26/2015    Priority: Medium  . Hypothyroidism 12/20/2010    Priority: Medium  . GERD (gastroesophageal reflux disease)     Priority: Low  . Thrombocytopenia ([place]) 03/30/2015    Priority: Low  . 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 Actinic keratosis 03/26/2015    Priority: Low  . Left hip flexor tightness 03/17/2016  . Nonallopathic lesion of lumbosacral region 03/17/2016  . Nonallopathic lesion of thoracic region 03/17/2016  . Nonallopathic lesion of sacral region 03/17/2016   Past Surgical History:  Procedure Laterality Date  . SEPTOPLASTY  2951; 1993   with turbinate reduction, atroplasty  . SHOULDER SURGERY Right 2012   labral tear  . TONSILLECTOMY AND ADENOIDECTOMY  1970    Family History  Problem Relation Age of Onset  . Pernicious anemia Mother   . Pernicious anemia Maternal Grandfather   . Alcohol abuse Maternal Grandfather   . Alcohol abuse Maternal Grandmother   . Diabetes Maternal Grandmother     Medications- reviewed and updated Current Outpatient Medications  Medication Sig Dispense Refill  .  buPROPion (WELLBUTRIN XL) 150 MG 24 hr tablet TAKE 1 TABLET(150 MG) BY MOUTH DAILY 91 tablet 3  . cyanocobalamin (,VITAMIN B-12,) 1000 MCG/ML injection Inject 1 mL (1,000 mcg total) into the muscle every 14 (fourteen) days. 25 mL 3  . folic acid (FOLVITE) 1 MG tablet Take 1 tablet (1 mg total) by mouth daily. 90 tablet 3  . pantoprazole (PROTONIX) 40 MG tablet TAKE 1 TABLET(40 MG) BY MOUTH DAILY 91 tablet 3  . SYNTHROID 175 MCG tablet Take 1 tablet (175 mcg total) by mouth daily before breakfast. 91 tablet 3  . Syringe/Needle, Disp, (SYRINGE 3CC/25GX1-1/2") 25G X 1-1/2" 3 ML MISC Use to administer B12 injection every 14 days or as directed. 100 each 1  . vitamin E (VITAMIN E) 1000 UNIT capsule Take 1,000 Units by mouth daily.     No current facility-administered medications for this visit.     Allergies-reviewed and updated Allergies  Allergen Reactions  . Penicillins     Social History   Social History Narrative   Married. [name].      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 He lives with his wife & 3 sons (twins [name] and [place] at [place], older [place] at [place], 1 son is grown [name]- to be married 2017). He is from [place], lived in [place] before relocating to [place].      Hobbies: enjoys yardwork, hiking, back packing when able   Exercises at home- walking, working  out, biking.     Objective: BP 112/74 (BP Location: Left Arm, Patient Position: Sitting, Cuff Size: Large)   Pulse 65   Temp 97.8 F (36.6 C)   Ht 6\' 1"  (1.854 m)   Wt 218 lb 3.2 oz (99 kg)   SpO2 95%   BMI 28.79 kg/m  Gen: NAD, resting comfortably HEENT: Mucous membranes are moist. Oropharynx normal, nasal turbinate narrow left turbinate Neck: no thyromegaly CV: RRR no murmurs rubs or gallops Lungs: CTAB no crackles, wheeze, rhonchi Abdomen: soft/nontender/nondistended/normal bowel sounds. No rebound or guarding.  Ext: no edema Skin: warm, dry Neuro: grossly normal, moves all extremities,  PERRLA  Rectal exam recently completed by urology along with PSA  Assessment/Plan:  57 y.o. male presenting for annual physical.  Health Maintenance counseling: 1. Anticipatory guidance: Patient counseled regarding regular dental exams -q6 months Dr. Sheralyn Boatman, eye exams  - will try for every year, wearing seatbelts.  2. Risk factor reduction:  Advised patient of need for regular exercise and diet rich and fruits and vegetables to reduce risk of heart attack and stroke. Exercise- has been down lately- strongly encouraged to get started- hard with his schedule. Diet-considering weight watchers but no fuly committed.  He knows he needs to reverse this Wt Readings from Last 3 Encounters:  11/23/17 218 lb 3.2 oz (99 kg)  10/12/17 216 lb 9.6 oz (98.2 kg)  04/06/17 207 lb 9.6 oz (94.2 kg)  3. Immunizations/screenings/ancillary studies- flu shot in october Immunization History  Administered Date(s) Administered  . Influenza Split 06/25/2013, 07/02/2014  . Influenza,inj,Quad PF,6+ Mos 06/02/2016  . Influenza-Unspecified 06/26/2015, 07/09/2017  . PPD Test 03/26/2015  . Td 07/03/2003, 07/02/2013  . Tdap 03/26/2015  5. Prostate cancer screening- they did this at his urology visit  5. Colon cancer screening - 2010 with Dr. Collene Mares- want to get records to confirm 6. Skin cancer screening- has been to [facility] dermatology ([place] in past).advised regular sunscreen use. Denies worrisome, changing, or new skin lesions.   Status of chronic or acute concerns   Monitor gilberts with labs.   May remove thrombocytopenia from labs if absent over next few checks  Depression, controlled Depression- reasonably controlled. More strongly situational depression with family issues with son [name]- feels like a death. He is not sure the medicine is helping and wants to attempt to wean= see avs wean. Obviously if he has worsening symptoms he should return to see [place] immediately. He is well aware and his wife is an excellent  Engineer, mining as well who will monitor for change  Hypothyroidism Synthroid 175 mcg controlling-  Lab Results  Component Value Date   TSH 3.95 10/12/2017    Pernicious anemia 1000 mcg injections q2 weeks. Update b12 with labs Lab Results  Component Value Date   VITAMINB12 487 12/10/2015     Vitamin D deficiency Vitamin D looked great last year- repeat this year   Future Appointments  Date Time Provider [place]  11/30/2017  8:15 AM [facility]   No Follow-up on file.  Lab/Order associations: Preventative health care - Plan: CBC, Comprehensive metabolic panel, Lipid panel, VITAMIN D 25 Hydroxy (Vit-D Deficiency, Fractures)  Postnasal drip - Plan: Ambulatory referral to ENT  History of nasal septoplasty - Plan: Ambulatory referral to ENT  Vitamin D deficiency - Plan: VITAMIN D 25 Hydroxy (Vit-D Deficiency, Fractures)  Hypothyroidism, unspecified type - Plan: CBC, Comprehensive metabolic panel, Lipid panel  Pernicious anemia - Plan: Vitamin B12  Meds ordered this encounter  Medications  . folic acid (FOLVITE) 1 MG tablet    Sig: Take 1 tablet (1 mg total) by mouth daily.    Dispense:  90 tablet    Refill:  3  . buPROPion (WELLBUTRIN XL) 150 MG 24 hr tablet    Sig: TAKE 1 TABLET(150 MG) BY MOUTH DAILY    Dispense:  91 tablet    Refill:  3  . cyanocobalamin (,VITAMIN B-12,) 1000 MCG/ML injection    Sig: Inject 1 mL (1,000 mcg total) into the muscle every 14 (fourteen) days.    Dispense:  25 mL    Refill:  3  . pantoprazole (PROTONIX) 40 MG tablet    Sig: TAKE 1 TABLET(40 MG) BY MOUTH DAILY    Dispense:  91 tablet    Refill:  3  . SYNTHROID 175 MCG tablet    Sig: Take 1 tablet (175 mcg total) by mouth daily before breakfast.    Dispense:  91 tablet    Refill:  3    Return precautions advised.  [name], MD

## 2017-11-23 NOTE — Patient Instructions (Addendum)
Sign release of information at the check out desk for Dr. Collene Mares from last colonoscopy  Try every other day wellbutrin for 2 weeks then try 1/2 tablet every other day for 2 weeks then stoph  Schedule a lab visit at the check out desk within 2 weeks. Return for future fasting labs meaning nothing but water after midnight please. Ok to take your medications with water.

## 2017-11-24 NOTE — Assessment & Plan Note (Signed)
Synthroid 175 mcg controlling-  Lab Results  Component Value Date   TSH 3.95 10/12/2017

## 2017-11-24 NOTE — Assessment & Plan Note (Signed)
Depression- reasonably controlled. More strongly situational depression with family issues with son [name]- feels like a death. He is not sure the medicine is helping and wants to attempt to wean= see avs wean. Obviously if he has worsening symptoms he should return to see [place] immediately. He is well aware and his wife is an excellent Engineer, mining as well who will monitor for change

## 2017-11-24 NOTE — Assessment & Plan Note (Signed)
Vitamin D looked great last year- repeat this year

## 2017-11-24 NOTE — Assessment & Plan Note (Addendum)
1000 mcg injections q2 weeks. Update b12 with labs Lab Results  Component Value Date   VITAMINB12 487 12/10/2015

## 2017-11-30 ENCOUNTER — Other Ambulatory Visit (INDEPENDENT_AMBULATORY_CARE_PROVIDER_SITE_OTHER): Payer: BC Managed Care – PPO

## 2017-11-30 DIAGNOSIS — Z0000 Encounter for general adult medical examination without abnormal findings: Secondary | ICD-10-CM | POA: Diagnosis not present

## 2017-11-30 DIAGNOSIS — E559 Vitamin D deficiency, unspecified: Secondary | ICD-10-CM | POA: Diagnosis not present

## 2017-11-30 DIAGNOSIS — D510 Vitamin B12 deficiency anemia due to intrinsic factor deficiency: Secondary | ICD-10-CM | POA: Diagnosis not present

## 2017-11-30 DIAGNOSIS — E039 Hypothyroidism, unspecified: Secondary | ICD-10-CM

## 2017-11-30 LAB — COMPREHENSIVE METABOLIC PANEL
ALBUMIN: 4.3 g/dL (ref 3.5–5.2)
ALT: 15 U/L (ref 0–53)
AST: 13 U/L (ref 0–37)
Alkaline Phosphatase: 44 U/L (ref 39–117)
BUN: 14 mg/dL (ref 6–23)
CALCIUM: 9.5 mg/dL (ref 8.4–10.5)
CHLORIDE: 104 meq/L (ref 96–112)
CO2: 31 mEq/L (ref 19–32)
Creatinine, Ser: 1.06 mg/dL (ref 0.40–1.50)
GFR: 76.87 mL/min (ref >60.00)
Glucose, Bld: 107 mg/dL — ABNORMAL HIGH (ref 70–99)
Potassium: 4.4 mEq/L (ref 3.5–5.1)
Sodium: 141 mEq/L (ref 135–145)
Total Bilirubin: 1.2 mg/dL (ref 0.2–1.2)
Total Protein: 6.5 g/dL (ref 6.0–8.3)

## 2017-11-30 LAB — CBC
HEMATOCRIT: 44.9 % (ref 39.0–52.0)
Hemoglobin: 15.3 g/dL (ref 13.0–17.0)
MCHC: 34.2 g/dL (ref 30.0–36.0)
MCV: 89.2 fl (ref 78.0–100.0)
PLATELETS: 132 10*3/uL — AB (ref 150.0–400.0)
RBC: 5.03 Mil/uL (ref 4.22–5.81)
RDW: 12.9 % (ref 11.5–15.5)
WBC: 4.8 10*3/uL (ref 4.0–10.5)

## 2017-11-30 LAB — VITAMIN D 25 HYDROXY (VIT D DEFICIENCY, FRACTURES): VITD: 28.29 ng/mL — AB (ref 30.00–100.00)

## 2017-11-30 LAB — LIPID PANEL
Cholesterol: 158 mg/dL (ref 0–200)
HDL: 35.9 mg/dL — AB (ref >39.00)
LDL Cholesterol: 100 mg/dL — ABNORMAL HIGH (ref 0–99)
NonHDL: 122.16
Total CHOL/HDL Ratio: 4
Triglycerides: 110 mg/dL (ref 0.0–149.0)
VLDL: 22 mg/dL (ref 0.0–40.0)

## 2017-11-30 LAB — VITAMIN B12: VITAMIN B 12: 571 pg/mL (ref 211–911)

## 2017-12-03 ENCOUNTER — Encounter: Payer: Self-pay | Admitting: Family Medicine

## 2017-12-14 DIAGNOSIS — R0982 Postnasal drip: Secondary | ICD-10-CM | POA: Insufficient documentation

## 2017-12-14 DIAGNOSIS — J342 Deviated nasal septum: Secondary | ICD-10-CM | POA: Insufficient documentation

## 2017-12-14 DIAGNOSIS — K219 Gastro-esophageal reflux disease without esophagitis: Secondary | ICD-10-CM | POA: Insufficient documentation

## 2018-05-27 ENCOUNTER — Other Ambulatory Visit: Payer: Self-pay | Admitting: Family Medicine

## 2018-08-23 ENCOUNTER — Ambulatory Visit (INDEPENDENT_AMBULATORY_CARE_PROVIDER_SITE_OTHER): Payer: BC Managed Care – PPO | Admitting: Family Medicine

## 2018-08-23 ENCOUNTER — Encounter: Payer: Self-pay | Admitting: Family Medicine

## 2018-08-23 VITALS — BP 104/78 | HR 64 | Temp 98.5°F | Resp 16 | Wt 202.0 lb

## 2018-08-23 DIAGNOSIS — J069 Acute upper respiratory infection, unspecified: Secondary | ICD-10-CM | POA: Diagnosis not present

## 2018-08-23 MED ORDER — ALBUTEROL SULFATE HFA 108 (90 BASE) MCG/ACT IN AERS: 2.0000 | INHALATION_SPRAY | Freq: Four times a day (QID) | RESPIRATORY_TRACT | 0 refills | Status: DC | PRN

## 2018-08-23 MED ORDER — METHYLPREDNISOLONE ACETATE 80 MG/ML IJ SUSP
80.0000 mg | Freq: Once | INTRAMUSCULAR | Status: AC
  Administered 2018-08-23: 80 mg via INTRAMUSCULAR

## 2018-08-23 NOTE — Progress Notes (Signed)
PCP: Marin Olp, MD  Subjective:  Eric Vazquez is a 57 y.o. year old very pleasant male patient who presents with Upper Respiratory infection symptoms including nasal congestion, cough, hoarseness, cough  Feels somewhat winded- feels congested in throat. He is also Hoarse. Gets Coughing fits- Nonproductive. Dealing with fatigue. sinus congestion/trouble breathing at night-Cant breath through nose. Some sinus pressure. Drains at night  -started: yesterday, symptoms are worsening -previous treatments: xicam yesterday. Delsym tried last night. Out of date albuterol helped last night that wife gave him -sick contacts/travel/risks: Family members here for son Ben's wedding- multiple virus exposures. No known flu or strep exposure.  -Hx of: allergies  ROS-denies fever, SOB, NVD, tooth pain  Pertinent Past Medical History-  Patient Active Problem List   Diagnosis Date Noted  . Vitamin D deficiency 12/10/2015    Priority: Medium  . Depression, controlled 09/10/2015    Priority: Medium  . Pernicious anemia 03/26/2015    Priority: Medium  . Hypothyroidism 12/20/2010    Priority: Medium  . GERD (gastroesophageal reflux disease)     Priority: Low  . Thrombocytopenia ([place]) 03/30/2015    Priority: Low  . 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 Actinic keratosis 03/26/2015    Priority: Low  . Left hip flexor tightness 03/17/2016  . Nonallopathic lesion of lumbosacral region 03/17/2016  . Nonallopathic lesion of thoracic region 03/17/2016  . Nonallopathic lesion of sacral region 03/17/2016   Medications- reviewed  Current Outpatient Medications  Medication Sig Dispense Refill  . buPROPion (WELLBUTRIN XL) 150 MG 24 hr tablet TAKE 1 TABLET(150 MG) BY MOUTH DAILY 91 tablet 3  . cyanocobalamin (,VITAMIN B-12,) 1000 MCG/ML injection Inject 1 mL (1,000 mcg total) into the muscle every 14 (fourteen) days. 25 mL 3  . folic acid (FOLVITE) 1 MG tablet Take 1 tablet (1 mg total) by  mouth daily. 90 tablet 3  . pantoprazole (PROTONIX) 40 MG tablet TAKE 1 TABLET(40 MG) BY MOUTH DAILY 91 tablet 3  . SYNTHROID 175 MCG tablet Take 1 tablet (175 mcg total) by mouth daily before breakfast. 91 tablet 3  . Syringe/Needle, Disp, (SYRINGE 3CC/25GX1-1/2") 25G X 1-1/2" 3 ML MISC Use to administer B12 injection every 14 days or as directed. 100 each 1  . vitamin E (VITAMIN E) 1000 UNIT capsule Take 1,000 Units by mouth daily.    Marland Kitchen albuterol (PROVENTIL HFA;VENTOLIN HFA) 108 (90 Base) MCG/ACT inhaler Inhale 2 puffs into the lungs every 6 (six) hours as needed for wheezing or shortness of breath. 1 Inhaler 0   Objective: BP 104/78   Pulse 64   Temp 98.5 F (36.9 C) (Oral)   Resp 16   Wt 202 lb (91.6 kg)   SpO2 99%   BMI 26.65 kg/m  Gen: NAD, resting comfortably HEENT: Turbinates erythematous, TM normal, pharynx mildly erythematous with no tonsilar exudate or edema, some sinus tenderness CV: RRR no murmurs rubs or gallops Lungs: CTAB no crackles, wheeze, rhonchi Abdomen: soft/nontender/nondistended/normal bowel sounds.  Ext: no edema Skin: warm, dry, no rash  Assessment/Plan:  Upper Respiratory infection History and exam today are suggestive of viral infection most likely due to upper respiratory infection. Symptomatic treatment with:  Meds ordered this encounter  Medications  . albuterol (PROVENTIL HFA;VENTOLIN HFA) 108 (90 Base) MCG/ACT inhaler    Sig: Inhale 2 puffs into the lungs every 6 (six) hours as needed for wheezing or shortness of breath.    Dispense:  1 Inhaler    Refill:  0  . methylPREDNISolone acetate (DEPO-MEDROL) injection 80 mg   We discussed that we did not find any infection that had higher probability of being bacterial such as pneumonia or strep throat. We discussed signs that bacterial infection may have developed particularly fever or shortness of breath. Likely course of 1-2 weeks. Patient is contagious and advised good handwashing and consideration  of mask If going to be in public places.   Finally, we reviewed reasons to return to care including if symptoms worsen or persist or new concerns arise- once again particularly shortness of breath or fever.  Also please reference after visit summary  [name], MD

## 2018-08-23 NOTE — Patient Instructions (Addendum)
I agree with you- likely viral illness  Trial albuterol for cough or wheeze. Delsym is also reasonable.   Trial depo medrol 80mg  IM as antiinflammatory to see if that can help with symptom management  If you ger fever, shortness of breath, worsening symptoms...  Or sinus pressure lasting past 10 days or improving then worsening let me know and we can consider some doxycycline or azithormycin for sinusitis

## 2018-11-29 ENCOUNTER — Other Ambulatory Visit: Payer: Self-pay | Admitting: Family Medicine

## 2018-12-15 ENCOUNTER — Other Ambulatory Visit: Payer: Self-pay | Admitting: Family Medicine

## 2018-12-20 ENCOUNTER — Encounter: Payer: Self-pay | Admitting: Family Medicine

## 2018-12-20 ENCOUNTER — Ambulatory Visit: Payer: BC Managed Care – PPO | Admitting: Family Medicine

## 2018-12-20 ENCOUNTER — Other Ambulatory Visit: Payer: Self-pay

## 2018-12-20 VITALS — BP 118/72 | HR 63 | Temp 98.1°F | Ht 73.0 in | Wt 204.2 lb

## 2018-12-20 DIAGNOSIS — F329 Major depressive disorder, single episode, unspecified: Secondary | ICD-10-CM

## 2018-12-20 DIAGNOSIS — D510 Vitamin B12 deficiency anemia due to intrinsic factor deficiency: Secondary | ICD-10-CM

## 2018-12-20 DIAGNOSIS — Z125 Encounter for screening for malignant neoplasm of prostate: Secondary | ICD-10-CM

## 2018-12-20 DIAGNOSIS — E559 Vitamin D deficiency, unspecified: Secondary | ICD-10-CM

## 2018-12-20 DIAGNOSIS — E039 Hypothyroidism, unspecified: Secondary | ICD-10-CM

## 2018-12-20 DIAGNOSIS — Z6826 Body mass index (BMI) 26.0-26.9, adult: Secondary | ICD-10-CM

## 2018-12-20 DIAGNOSIS — Z0000 Encounter for general adult medical examination without abnormal findings: Secondary | ICD-10-CM

## 2018-12-20 DIAGNOSIS — F32A Depression, unspecified: Secondary | ICD-10-CM

## 2018-12-20 DIAGNOSIS — D696 Thrombocytopenia, unspecified: Secondary | ICD-10-CM

## 2018-12-20 DIAGNOSIS — E785 Hyperlipidemia, unspecified: Secondary | ICD-10-CM | POA: Diagnosis not present

## 2018-12-20 LAB — COMPREHENSIVE METABOLIC PANEL
ALBUMIN: 4.6 g/dL (ref 3.5–5.2)
ALT: 16 U/L (ref 0–53)
AST: 14 U/L (ref 0–37)
Alkaline Phosphatase: 52 U/L (ref 39–117)
BILIRUBIN TOTAL: 1 mg/dL (ref 0.2–1.2)
BUN: 15 mg/dL (ref 6–23)
CALCIUM: 9.2 mg/dL (ref 8.4–10.5)
CHLORIDE: 104 meq/L (ref 96–112)
CO2: 30 mEq/L (ref 19–32)
CREATININE: 1.09 mg/dL (ref 0.40–1.50)
GFR: 69.77 mL/min (ref >60.00)
Glucose, Bld: 97 mg/dL (ref 70–99)
Potassium: 4.3 mEq/L (ref 3.5–5.1)
SODIUM: 140 meq/L (ref 135–145)
TOTAL PROTEIN: 6.8 g/dL (ref 6.0–8.3)

## 2018-12-20 LAB — LIPID PANEL
Cholesterol: 171 mg/dL (ref 0–200)
HDL: 44.8 mg/dL (ref >39.00)
LDL Cholesterol: 105 mg/dL — ABNORMAL HIGH (ref 0–99)
NonHDL: 126.12
Total CHOL/HDL Ratio: 4
Triglycerides: 105 mg/dL (ref 0.0–149.0)
VLDL: 21 mg/dL (ref 0.0–40.0)

## 2018-12-20 LAB — CBC
HCT: 45.5 % (ref 39.0–52.0)
Hemoglobin: 15.6 g/dL (ref 13.0–17.0)
MCHC: 34.4 g/dL (ref 30.0–36.0)
MCV: 90 fl (ref 78.0–100.0)
PLATELETS: 131 10*3/uL — AB (ref 150.0–400.0)
RBC: 5.05 Mil/uL (ref 4.22–5.81)
RDW: 13.1 % (ref 11.5–15.5)
WBC: 5.2 10*3/uL (ref 4.0–10.5)

## 2018-12-20 LAB — VITAMIN B12: Vitamin B-12: 509 pg/mL (ref 211–911)

## 2018-12-20 LAB — TSH: TSH: 2.61 u[IU]/mL (ref 0.35–4.50)

## 2018-12-20 LAB — VITAMIN D 25 HYDROXY (VIT D DEFICIENCY, FRACTURES): VITD: 32.8 ng/mL (ref 30.00–100.00)

## 2018-12-20 MED ORDER — CYANOCOBALAMIN 1000 MCG/ML IJ SOLN: INTRAMUSCULAR | 3 refills | Status: DC

## 2018-12-20 MED ORDER — "SYRINGE 25G X 1"" 3 ML MISC": 0 refills | Status: AC

## 2018-12-20 NOTE — Patient Instructions (Addendum)
Sign release of information at the check out desk for last colonoscopy from Dr. Collene Mares  If you dont mind send me a message in about a month to check in to see if we got those records so I can put a referral in to Dr. Carlean Purl  Team please have him fill out [id number]  Please stop by lab before you go If you do not have mychart- we will call you about results within 5 business days of [place] receiving them.  If you have mychart- we will send your results within 3 business days of [place] receiving them.  If abnormal or we want to clarify a result, we will call or mychart you to make sure you receive the message.  If you have questions or concerns or don't hear within 5-7 days, please send [place] a message or call us.

## 2018-12-20 NOTE — Progress Notes (Signed)
Phone: [phone]   Subjective:  Patient presents today for their annual physical. Chief complaint-noted.   See problem oriented charting- ROS- full  review of systems was completed and negative including No chest pain or shortness of breath. No headache or blurry vision.    The following were reviewed and entered/updated in epic: Past Medical History:  Diagnosis Date  . Allergy   . Chicken pox   . GERD (gastroesophageal reflux disease)   . Gilbert disease   . Hearing loss    L ear, has hearing aid. high speed suction over years  . Hemorrhoids   . Hypothyroidism   . Pernicious anemia   . Thrombocytopenia ([place])   . Ulnar neuropathy at elbow of right upper extremity    Patient Active Problem List   Diagnosis Date Noted  . Vitamin D deficiency 12/10/2015    Priority: Medium  . Depression, controlled 09/10/2015    Priority: Medium  . Pernicious anemia 03/26/2015    Priority: Medium  . Hypothyroidism 12/20/2010    Priority: Medium  . GERD (gastroesophageal reflux disease)     Priority: Low  . Thrombocytopenia ([place]) 03/30/2015    Priority: Low  . 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 Actinic keratosis 03/26/2015    Priority: Low  . Left hip flexor tightness 03/17/2016  . Nonallopathic lesion of lumbosacral region 03/17/2016  . Nonallopathic lesion of thoracic region 03/17/2016  . Nonallopathic lesion of sacral region 03/17/2016   Past Surgical History:  Procedure Laterality Date  . SEPTOPLASTY  2706; 1993   with turbinate reduction, atroplasty  . SHOULDER SURGERY Right 2012   labral tear  . TONSILLECTOMY AND ADENOIDECTOMY  1970    Family History  Problem Relation Age of Onset  . Pernicious anemia Mother   . Pernicious anemia Maternal Grandfather   . Alcohol abuse Maternal Grandfather   . Alcohol abuse Maternal Grandmother   . Diabetes Maternal Grandmother     Medications- reviewed and updated Current Outpatient Medications  Medication Sig  Dispense Refill  . albuterol (PROVENTIL HFA;VENTOLIN HFA) 108 (90 Base) MCG/ACT inhaler Inhale 2 puffs into the lungs every 6 (six) hours as needed for wheezing or shortness of breath. 1 Inhaler 0  . cyanocobalamin (,VITAMIN B-12,) 1000 MCG/ML injection INJECT 1 ML INTO THE MUSCLE EVERY 14 DAYS 10 mL 3  . folic acid (FOLVITE) 1 MG tablet Take 1 tablet (1 mg total) by mouth daily. 90 tablet 3  . sildenafil (REVATIO) 20 MG tablet Take 20 mg by mouth daily. Usually takes 3 tablets- through urology    . SYNTHROID 175 MCG tablet TAKE 1 TABLET(175 MCG) BY MOUTH DAILY BEFORE BREAKFAST 91 tablet 3  . Syringe/Needle, Disp, (SYRINGE 3CC/25GX1-1/2") 25G X 1-1/2" 3 ML MISC Use to administer B12 injection every 14 days or as directed. 100 each 1  . vitamin E (VITAMIN E) 1000 UNIT capsule Take 1,000 Units by mouth daily.    . Syringe/Needle, Disp, (SYRINGE 3CC/25GX1") 25G X 1" 3 ML MISC INJECT 1 ML INTO THE MUSCLE EVERY 14 DAYS 50 each 0   No current facility-administered medications for this visit.     Allergies-reviewed and updated Allergies  Allergen Reactions  . Penicillins     Social History   Social History Narrative   Married. [name].      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 He lives with his wife & 3 sons (twins [name] and Warden/ranger at [employer], older Walker at  [employer], 1 son is grown [name]- to be married 2017). He is from [place], lived in [place] before relocating to [place].      Hobbies: enjoys yardwork, hiking, back packing when able   Exercises at home- walking, working out, biking.    Objective  Objective:  BP 118/72 (BP Location: Left Arm, Patient Position: Sitting, Cuff Size: Large)   Pulse 63   Temp 98.1 F (36.7 C) (Oral)   Ht 6\' 1"  (1.854 m)   Wt 204 lb 3.2 oz (92.6 kg)   SpO2 98%   BMI 26.94 kg/m  Gen: NAD, resting comfortably HEENT: Mucous membranes are moist. Oropharynx normal Neck: no thyromegaly or cervical lymphadenopathy CV: RRR no murmurs rubs or  gallops Lungs: CTAB no crackles, wheeze, rhonchi Abdomen: soft/nontender/nondistended/normal bowel sounds. No rebound or guarding. Reasonable weight- down from last year Ext: no edema Skin: warm, dry Neuro: grossly normal, moves all extremities, PERRLA  Has rectal exam with urology   Assessment and Plan  58 y.o. male presenting for annual physical.  Health Maintenance counseling: 1. Anticipatory guidance: Patient counseled regarding regular dental exams -q6 months, eye exams -yearly,  avoiding smoking and second hand smoke , limiting alcohol to 2 beverages per day .   2. Risk factor reduction:  Advised patient of need for regular exercise and diet rich and fruits and vegetables to reduce risk of heart attack and stroke. Exercise-  Has had to bump up hours at work and has not been able to exercise more. Diet-doing weight watchers app and has enjoyed that.  Wt Readings from Last 3 Encounters:  12/20/18 204 lb 3.2 oz (92.6 kg)  08/23/18 202 lb (91.6 kg)  11/23/17 218 lb 3.2 oz (99 kg)  3. Immunizations/screenings/ancillary studies- will plan on shingrix next year Immunization History  Administered Date(s) Administered  . Influenza Inj Mdck Quad Pf 06/22/2018  . Influenza Split 06/25/2013, 07/02/2014  . Influenza,inj,Quad PF,6+ Mos 06/02/2016  . Influenza-Unspecified 06/26/2015, 07/09/2017  . PPD Test 03/26/2015  . Td 07/03/2003, 07/02/2013  . Tdap 03/26/2015  4. Prostate cancer screening- screened with urology 5. Colon cancer screening - will get this done with Dr. Carlean Purl- we need to get records first 6. Skin cancer screening- [facility] dermatology. advised regular sunscreen use. Denies worrisome, changing, or new skin lesions.  7. never smoker  Status of chronic or acute concerns   Monitor gilberts with labs  Watch thrombocytopenia with labs  Watching vit d- taking this and wil adjust. Wife watching  Depression- reasonably controlled. Still poor contact with oldest son which has  been hard with him.   Hypothyroidism- on synthroid 175 mcg- update tsh today  Pernicious anemia- 1000 mcg every 2 weeks. Update b12  ED on sildenafil   Lab/Order associations: fasting Preventative health care - Plan: CBC, Comprehensive metabolic panel, Lipid panel, VITAMIN D 25 Hydroxy (Vit-D Deficiency, Fractures), Vitamin B12, CANCELED: PSA  Hyperlipidemia, unspecified hyperlipidemia type - Plan: CBC, Comprehensive metabolic panel, Lipid panel  Vitamin D deficiency - Plan: VITAMIN D 25 Hydroxy (Vit-D Deficiency, Fractures)  Pernicious anemia - Plan: Vitamin B12  Thrombocytopenia ([facility]) - Plan: CBC  Hypothyroidism, unspecified type - Plan: TSH  Screening for prostate cancer - Plan: CANCELED: PSA  Depression, controlled  BMI 26.0-26.9,adult  Meds ordered this encounter  Medications  . Syringe/Needle, Disp, (SYRINGE 3CC/25GX1") 25G X 1" 3 ML MISC    Sig: INJECT 1 ML INTO THE MUSCLE EVERY 14 DAYS    Dispense:  50 each    Refill:  0  .  DISCONTD: cyanocobalamin (,VITAMIN B-12,) 1000 MCG/ML injection    Sig: INJECT 1 ML INTO THE MUSCLE EVERY 14 DAYS    Dispense:  25 mL    Refill:  3  . cyanocobalamin (,VITAMIN B-12,) 1000 MCG/ML injection    Sig: INJECT 1 ML INTO THE MUSCLE EVERY 14 DAYS    Dispense:  10 mL    Refill:  3    Please give at least 3 month supply   Return precautions advised.  [name], MD

## 2018-12-31 ENCOUNTER — Other Ambulatory Visit: Payer: Self-pay | Admitting: Family Medicine

## 2019-01-16 ENCOUNTER — Telehealth: Payer: Self-pay | Admitting: Family Medicine

## 2019-01-16 DIAGNOSIS — Z1211 Encounter for screening for malignant neoplasm of colon: Secondary | ICD-10-CM

## 2019-01-16 NOTE — Telephone Encounter (Signed)
Will send colonoscopy to be scanned in. On 12/18/08 had "normal colonoscopy up to the terminal ileum except for small internal hemorrhoids" from Dr. Collene Mares at [facility] (not photographing well for some reason).   Referring to GI for when they are able to resume routine colonoscopies in light of covid 19

## 2019-01-28 ENCOUNTER — Encounter: Payer: Self-pay | Admitting: Family Medicine

## 2019-07-14 ENCOUNTER — Ambulatory Visit: Payer: Self-pay | Admitting: *Deleted

## 2019-07-14 NOTE — Telephone Encounter (Signed)
See note

## 2019-07-14 NOTE — Telephone Encounter (Signed)
FYI, pt scheduled for tomorrow (10/6)

## 2019-07-14 NOTE — Telephone Encounter (Signed)
I returned pt's call.   He is c/o having an episode a week ago of having chest pain while driving home.   No other symptoms with it.  He has been feeling a little winded with walking up a hill in his neighborhood.  He had to stop and get his breath which is not usual for him. working in the yard and once while in his office.  The chest pain lasted about 20 minutes.  Pain scale of 2. No cardiac history.   Been under a lot of stress over the last several weeks.  I warm transferred his call to [facility] in Dr. Ansel Bong office to be scheduled per the protocol.  I sent these notes to Dr. Kizzie Fantasia office.   Reason for Disposition . [1] Chest pain lasts > 5 minutes AND [2] occurred > 3 days ago (72 hours) AND [3] NO chest pain or cardiac symptoms now  Answer Assessment - Initial Assessment Questions 1. LOCATION: "Where does it hurt?"       I feel winded when walking.   I was driving home from work and had chest pain for 20 minutes. Pain 2 on scale.    This has happened twice.   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   It's to the right of my midline chest.I felt it at work once. 2. RADIATION: "Does the pain go anywhere else?" (e.g., into neck, jaw, arms, back)     No radiation.   3. ONSET: "When did the chest pain begin?" (Minutes, hours or days)      A week ago 4. PATTERN "Does the pain come and go, or has it been constant since it started?"  "Does it get worse with exertion?"      Winded with working in yard.   I had brief periods of feeling winded. 5. DURATION: "How long does it last" (e.g., seconds, minutes, hours)     20 minutes chest pain when driving home.   6. SEVERITY: "How bad is the pain?"  (e.g., Scale 1-10; mild, moderate, or severe)    - MILD (1-3): doesn't interfere with normal activities     - MODERATE (4-7): interferes with normal activities or awakens from sleep    - SEVERE (8-10): excruciating pain, unable to do any normal activities       2 on pain scale 7. CARDIAC  RISK FACTORS: "Do you have any history of heart problems or risk factors for heart disease?" (e.g., angina, prior heart attack; diabetes, high blood pressure, high cholesterol, smoker, or strong family history of heart disease)     Exercise not much.   Weight up right now.   8. PULMONARY RISK FACTORS: "Do you have any history of lung disease?"  (e.g., blood clots in lung, asthma, emphysema, birth control pills)     None 9. 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 10. OTHER SYMPTOMS: "Do you have any other symptoms?" (e.g., dizziness, nausea, vomiting, sweating, fever, difficulty breathing, cough)       No other symptoms. 11. PREGNANCY: "Is there any chance you are pregnant?" "When was your last menstrual period?"       N/A  Protocols used: CHEST PAIN-A-AH

## 2019-07-14 NOTE — Telephone Encounter (Signed)
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

## 2019-07-15 ENCOUNTER — Other Ambulatory Visit: Payer: Self-pay

## 2019-07-15 ENCOUNTER — Encounter: Payer: Self-pay | Admitting: Family Medicine

## 2019-07-15 ENCOUNTER — Ambulatory Visit: Payer: BC Managed Care – PPO | Admitting: Family Medicine

## 2019-07-15 VITALS — BP 122/76 | HR 52 | Temp 98.0°F | Resp 16 | Ht 73.0 in | Wt 212.2 lb

## 2019-07-15 DIAGNOSIS — R072 Precordial pain: Secondary | ICD-10-CM | POA: Diagnosis not present

## 2019-07-15 NOTE — Patient Instructions (Signed)
Please follow up if symptoms do not improve or as needed.   I've ordered a treadmill stress test; we will call you to get this scheduled.   In the meantime, no exertion, start daily aspirin 81mg  and daily omeprazole 20mg .   Nonspecific Chest Pain, Adult Chest pain can be caused by many different conditions. It can be caused by a condition that is life-threatening and requires treatment right away. It can also be caused by something that is not life-threatening. If you have chest pain, it can be hard to know the difference, so it is important to get help right away to make sure that you do not have a serious condition. Some life-threatening causes of chest pain include:  Heart attack.  A tear in the body's main blood vessel (aortic dissection).  Inflammation around your heart (pericarditis).  A problem in the lungs, such as a blood clot (pulmonary embolism) or a collapsed lung (pneumothorax). Some non life-threatening causes of chest pain include:  Heartburn.  Anxiety or stress.  Damage to the bones, muscles, and cartilage that make up your chest wall.  Pneumonia or bronchitis.  Shingles infection (varicella-zoster virus). Chest pain can feel like:  Pain or discomfort on the surface of your chest or deep in your chest.  Crushing, pressure, aching, or squeezing pain.  Burning or tingling.  Dull or sharp pain that is worse when you move, cough, or take a deep breath.  Pain or discomfort that is also felt in your back, neck, jaw, shoulder, or arm, or pain that spreads to any of these areas. Your chest pain may come and go. It may also be constant. Your health care provider will do lab tests and other studies to find the cause of your pain. Treatment will depend on the cause of your chest pain. Follow these instructions at home: Medicines  Take over-the-counter and prescription medicines only as told by your health care provider.  If you were prescribed an antibiotic, take it  as told by your health care provider. Do not stop taking the antibiotic even if you start to feel better. Lifestyle   Rest as directed by your health care provider.  Do not use any products that contain nicotine or tobacco, such as cigarettes and e-cigarettes. If you need help quitting, ask your health care provider.  Do not drink alcohol.  Make healthy lifestyle choices as recommended. These may include: ? Getting regular exercise. Ask your health care provider to suggest some activities that are safe for you. ? Eating a heart-healthy diet. This includes plenty of fresh fruits and vegetables, whole grains, low-fat (lean) protein, and low-fat dairy products. A dietitian can help you find healthy eating options. ? Maintaining a healthy weight. ? Managing any other health conditions you have, such as high blood pressure (hypertension) or diabetes. ? Reducing stress, such as with yoga or relaxation techniques. General instructions  Pay attention to any changes in your symptoms. Tell your health care provider about them or any new symptoms.  Avoid any activities that cause chest pain.  Keep all follow-up visits as told by your health care provider. This is important. This includes visits for any further testing if your chest pain does not go away. Contact a health care provider if:  Your chest pain does not go away.  You feel depressed.  You have a fever. Get help right away if:  Your chest pain gets worse.  You have a cough that gets worse, or you cough up blood.  You have severe pain in your abdomen.  You faint.  You have sudden, unexplained chest discomfort.  You have sudden, unexplained discomfort in your arms, back, neck, or jaw.  You have shortness of breath at any time.  You suddenly start to sweat, or your skin gets clammy.  You feel nausea or you vomit.  You suddenly feel lightheaded or dizzy.  You have severe weakness, or unexplained weakness or fatigue.   Your heart begins to beat quickly, or it feels like it is skipping beats. These symptoms may represent a serious problem that is an emergency. Do not wait to see if the symptoms will go away. Get medical help right away. Call your local emergency services (911 in the [place] Do not drive yourself to the hospital. Summary  Chest pain can be caused by a condition that is serious and requires urgent treatment. It may also be caused by something that is not life-threatening.  If you have chest pain, it is very important to see your health care provider. Your health care provider may do lab tests and other studies to find the cause of your pain.  Follow your health care provider's instructions on taking medicines, making lifestyle changes, and getting emergency treatment if symptoms become worse.  Keep all follow-up visits as told by your health care provider. This includes visits for any further testing if your chest pain does not go away. This information is not intended to replace advice given to you by your health care provider. Make sure you discuss any questions you have with your health care provider. Document Released: 07/05/2005 Document Revised: 03/28/2018 Document Reviewed: 03/28/2018 Elsevier Patient Education  2020 [employer].

## 2019-07-15 NOTE — Progress Notes (Signed)
Subjective  CC:  Chief Complaint  Patient presents with  . Chest Pain    Has had 2 episodes in the pask week, thinks it is more stress related.. Reports that pain is more right sided and last about 20 mins with pain level of 2-3.    HPI: Eric Vazquez is a 58 y.o. male who presents to the office today to address the problems listed above in the chief complaint.  Very pleasant healthy 58 year old male who presents due to recurrent episodes of right-sided and substernal chest pain described as tightness with mild difficulty taking deep breaths.  Most recently, occurred last week while he was driving home from work.  Pain lasted 20 to 30 minutes.  He denies nausea or associated diaphoresis.  No palpitations.  No recent history of heartburn although he has had some reflux symptoms in the past.  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  However no recent stressors.  He does not regularly use NSAIDs or drink alcohol.  He has had no history of diabetes, hypertension or hyperlipidemia.  No family history of heart disease.  However, he also reports that his exercise tolerance has been decreased over the last 2 months or so.  He is noted that he gets very winded while doing yard work.  In the past he could do this without any problems.  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  He did not have chest pain during those episodes.  However his shortness of breath did interfere with these activities.  Otherwise his energy levels okay.  He does admit to being deconditioned and being mostly sedentary.  He denies change in bowel habits, melena, abdominal pain, change in appetite, dyspnea on exertion, PND orthopnea.  No lower extremity edema. Assessment  1. Substernal chest pain      Plan   Substernal right-sided chest pain: Given his history and decrease exertional abilities recommend stress  testing.  Treadmill stress test ordered.  Education given.  Recommend baby aspirin daily, daily PPI and monitoring symptoms closely.  If he experiences further chest pain, recommend ER evaluation.  Follow-up with PCP after stress test completed. The 10-year ASCVD risk score Mikey Bussing DC Brooke Bonito., et al., 2013) is: 5.7%   Values used to calculate the score:     Age: 28 years     Sex: Male     Is Non-Hispanic African American: No     Diabetic: No     Tobacco smoker: No     Systolic Blood Pressure: 123XX123 mmHg     Is BP treated: No     HDL Cholesterol: 44.8 mg/dL     Total Cholesterol: 171 mg/dL  Follow up: stress test, then f/u with pcp  Visit date not found  Orders Placed This Encounter  Procedures  . Exercise Tolerance Test  . EKG 12-Lead   No orders of the defined types were placed in this encounter.     I reviewed the patients updated PMH, FH, and SocHx.    Patient Active Problem List   Diagnosis Date Noted  . Left hip flexor tightness 03/17/2016  . Nonallopathic lesion of lumbosacral region 03/17/2016  . Nonallopathic lesion of thoracic region 03/17/2016  . Nonallopathic lesion of sacral region 03/17/2016  . Vitamin D deficiency 12/10/2015  . GERD (gastroesophageal reflux disease)   . Depression, controlled 09/10/2015  .  Thrombocytopenia ([place]) 03/30/2015  . Rosanna Randy disease 03/30/2015  . Pernicious anemia 03/26/2015  . Actinic keratosis 03/26/2015  . Hypothyroidism 12/20/2010   Current Meds  Medication Sig  . cyanocobalamin (,VITAMIN B-12,) 1000 MCG/ML injection INJECT 1 ML INTO THE MUSCLE EVERY 14 DAYS  . folic acid (FOLVITE) 1 MG tablet TAKE 1 TABLET(1 MG) BY MOUTH DAILY  . SYNTHROID 175 MCG tablet TAKE 1 TABLET(175 MCG) BY MOUTH DAILY BEFORE BREAKFAST    Allergies: Patient is allergic to latex and penicillins. Family History: Patient family history includes Alcohol abuse in his maternal grandfather and maternal grandmother; Diabetes in his maternal grandmother; Pernicious  anemia in his maternal grandfather and mother. Social History:  Patient  reports that he has never smoked. He has never used smokeless tobacco. He reports current alcohol use. He reports that he does not use drugs.  Review of Systems: Constitutional: Negative for fever malaise or anorexia Cardiovascular: Positive for chest pain Respiratory: Positive for SOB or persistent cough Gastrointestinal: negative for abdominal pain  Objective  Vitals: BP 122/76   Pulse (!) 52   Temp 98 F (36.7 C) (Tympanic)   Resp 16   Ht 6\' 1"  (1.854 m)   Wt 212 lb 3.2 oz (96.3 kg)   SpO2 97%   BMI 28.00 kg/m  General: no acute distress , A&Ox3 HEENT: PEERL, conjunctiva normal, Oropharynx moist,neck is supple Cardiovascular:  RRR without murmur or gallop.  No chest wall pain, normal radial and pedal pulses Respiratory:  Good breath sounds bilaterally, CTAB with normal respiratory effort Gastrointestinal: soft, flat abdomen, normal active bowel sounds, no palpable masses, no hepatosplenomegaly, no appreciated hernias Extremities: No calf tenderness or edema Skin:  Warm, no rashes  Lab Results  Component Value Date   CHOL 171 12/20/2018   HDL 44.80 12/20/2018   LDLCALC 105 (H) 12/20/2018   TRIG 105.0 12/20/2018   CHOLHDL 4 12/20/2018   Lab Results  Component Value Date   CREATININE 1.09 12/20/2018   BUN 15 12/20/2018   NA 140 12/20/2018   K 4.3 12/20/2018   CL 104 12/20/2018   CO2 30 12/20/2018   Lab Results  Component Value Date   WBC 5.2 12/20/2018   HGB 15.6 12/20/2018   HCT 45.5 12/20/2018   MCV 90.0 12/20/2018   PLT 131.0 (L) 12/20/2018    EKG: brady,sinus and same when compared to 2015; mild j point elevation in v2 and v3   Commons side effects, risks, benefits, and alternatives for medications and treatment plan prescribed today were discussed, and the patient expressed understanding of the given instructions. Patient is instructed to call or message via MyChart if he/she has  any questions or concerns regarding our treatment plan. No barriers to understanding were identified. We discussed Red Flag symptoms and signs in detail. Patient expressed understanding regarding what to do in case of urgent or emergency type symptoms.   Medication list was reconciled, printed and provided to the patient in AVS. Patient instructions and summary information was reviewed with the patient as documented in the AVS. This note was prepared with assistance of Dragon voice recognition software. Occasional wrong-word or sound-a-like substitutions may have occurred due to the inherent limitations of voice recognition software

## 2019-07-30 ENCOUNTER — Other Ambulatory Visit: Payer: Self-pay | Admitting: Family Medicine

## 2019-07-31 ENCOUNTER — Telehealth: Payer: Self-pay | Admitting: Family Medicine

## 2019-07-31 DIAGNOSIS — R072 Precordial pain: Secondary | ICD-10-CM

## 2019-07-31 NOTE — Telephone Encounter (Signed)
Patient is calling to inform the doctor that he still has not heard from anyone regarding his exercise tolerance test that the doctor said he should have. Please advise and call to discuss. CB# [phone] This is the second time the patient is calling and he has not heard anything back.

## 2019-07-31 NOTE — Telephone Encounter (Signed)
Ordered October 6 by Dr. Jonni Sanger

## 2019-07-31 NOTE — Telephone Encounter (Signed)
See below, not seeing anything about an exercise tolerance test ordered by you.

## 2019-07-31 NOTE — Telephone Encounter (Signed)
See note

## 2019-08-04 ENCOUNTER — Encounter: Payer: Self-pay | Admitting: Internal Medicine

## 2019-08-04 NOTE — Addendum Note (Signed)
Addended by: Layla Barter on: 08/04/2019 09:44 AM   Modules accepted: Orders

## 2019-08-04 NOTE — Telephone Encounter (Signed)
Thank you :)

## 2019-08-04 NOTE — Telephone Encounter (Signed)
I need an imaging location attached to this order.  No one can see it otherwise.  I don't think it was a hard stop - but I still need an imaging location.  I spoke to him this morning - I will stay late and make sure this is done today since he has been waiting a while for it.  Edit / put in a new order for the [facility] location please.  Thanks!

## 2019-08-04 NOTE — Telephone Encounter (Signed)
New order placed with location

## 2019-08-19 DIAGNOSIS — J309 Allergic rhinitis, unspecified: Secondary | ICD-10-CM | POA: Insufficient documentation

## 2019-08-22 ENCOUNTER — Encounter (HOSPITAL_COMMUNITY): Payer: BC Managed Care – PPO

## 2019-08-22 ENCOUNTER — Other Ambulatory Visit: Payer: Self-pay | Admitting: Family Medicine

## 2019-08-29 ENCOUNTER — Other Ambulatory Visit: Payer: Self-pay

## 2019-08-29 ENCOUNTER — Encounter: Payer: Self-pay | Admitting: Internal Medicine

## 2019-08-29 ENCOUNTER — Ambulatory Visit (AMBULATORY_SURGERY_CENTER): Payer: BC Managed Care – PPO | Admitting: *Deleted

## 2019-08-29 VITALS — Temp 96.8°F | Ht 73.0 in | Wt 215.0 lb

## 2019-08-29 DIAGNOSIS — Z1211 Encounter for screening for malignant neoplasm of colon: Secondary | ICD-10-CM

## 2019-08-29 DIAGNOSIS — Z1159 Encounter for screening for other viral diseases: Secondary | ICD-10-CM

## 2019-08-29 NOTE — Progress Notes (Signed)
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 Please only bring one care partner. Please be aware that your care partner may wait in the car in the parking lot or if they feel like they will be too hot to wait in the car, they may wait in the lobby on the 4th floor. All care partners are required to wear a mask the entire time (we do not have any that we can provide them), they need to practice social distancing, and we will do a Covid check for all patient's and care partners when you arrive. Also we will check their temperature and your temperature. If the care partner waits in their car they need to stay in the parking lot the entire time and we will call them on their cell phone when the patient is ready for discharge so they can bring the car to the front of the building. Also all patient's will need to wear a mask into building.

## 2019-09-09 ENCOUNTER — Ambulatory Visit (INDEPENDENT_AMBULATORY_CARE_PROVIDER_SITE_OTHER): Payer: BC Managed Care – PPO

## 2019-09-09 ENCOUNTER — Other Ambulatory Visit: Payer: Self-pay | Admitting: Internal Medicine

## 2019-09-09 DIAGNOSIS — Z1159 Encounter for screening for other viral diseases: Secondary | ICD-10-CM

## 2019-09-10 LAB — SARS CORONAVIRUS 2 (TAT 6-24 HRS): SARS Coronavirus 2: NEGATIVE

## 2019-09-12 ENCOUNTER — Ambulatory Visit (AMBULATORY_SURGERY_CENTER): Payer: BC Managed Care – PPO | Admitting: Internal Medicine

## 2019-09-12 ENCOUNTER — Other Ambulatory Visit: Payer: Self-pay

## 2019-09-12 ENCOUNTER — Encounter: Payer: Self-pay | Admitting: Internal Medicine

## 2019-09-12 VITALS — BP 128/77 | HR 64 | Temp 97.7°F | Resp 15 | Ht 73.0 in | Wt 215.0 lb

## 2019-09-12 DIAGNOSIS — D125 Benign neoplasm of sigmoid colon: Secondary | ICD-10-CM

## 2019-09-12 DIAGNOSIS — Z1211 Encounter for screening for malignant neoplasm of colon: Secondary | ICD-10-CM

## 2019-09-12 MED ORDER — SODIUM CHLORIDE 0.9 % IV SOLN: 500.0000 mL | INTRAVENOUS | Status: DC

## 2019-09-12 NOTE — Patient Instructions (Addendum)
I found and removed one 2-3 mm polyp that looks benign, might be pre-cancerous. If pre-cancerous repeat exam 7-10 years, if not then 10 years.  All else ok - normal.  I appreciate the opportunity to care for you. Gatha Mayer, MD, [facility]   Discharge instructions given. Handout on polyps. Resume previous medications. YOU HAD AN ENDOSCOPIC PROCEDURE TODAY AT [facility]:   Refer to the procedure report that was given to you for any specific questions about what was found during the examination.  If the procedure report does not answer your questions, please call your gastroenterologist to clarify.  If you requested that your care partner not be given the details of your procedure findings, then the procedure report has been included in a sealed envelope for you to review at your convenience later.  YOU SHOULD EXPECT: Some feelings of bloating in the abdomen. Passage of more gas than usual.  Walking can help get rid of the air that was put into your GI tract during the procedure and reduce the bloating. If you had a lower endoscopy (such as a colonoscopy or flexible sigmoidoscopy) you may notice spotting of blood in your stool or on the toilet paper. If you underwent a bowel prep for your procedure, you may not have a normal bowel movement for a few days.  Please Note:  You might notice some irritation and congestion in your nose or some drainage.  This is from the oxygen used during your procedure.  There is no need for concern and it should clear up in a day or so.  SYMPTOMS TO REPORT IMMEDIATELY:   Following lower endoscopy (colonoscopy or flexible sigmoidoscopy):  Excessive amounts of blood in the stool  Significant tenderness or worsening of abdominal pains  Swelling of the abdomen that is new, acute  Fever of 100F or higher   For urgent or emergent issues, a gastroenterologist can be reached at any hour by calling [phone].   DIET:  We do recommend a small  meal at first, but then you may proceed to your regular diet.  Drink plenty of fluids but you should avoid alcoholic beverages for 24 hours.  ACTIVITY:  You should plan to take it easy for the rest of today and you should NOT DRIVE or use heavy machinery until tomorrow (because of the sedation medicines used during the test).    FOLLOW UP: Our staff will call the number listed on your records 48-72 hours following your procedure to check on you and address any questions or concerns that you may have regarding the information given to you following your procedure. If we do not reach you, we will leave a message.  We will attempt to reach you two times.  During this call, we will ask if you have developed any symptoms of COVID 19. If you develop any symptoms (ie: fever, flu-like symptoms, shortness of breath, cough etc.) before then, please call [phone].  If you test positive for Covid 19 in the 2 weeks post procedure, please call and report this information to [place].    If any biopsies were taken you will be contacted by phone or by letter within the next 1-3 weeks.  Please call us at [phone] if you have not heard about the biopsies in 3 weeks.    SIGNATURES/CONFIDENTIALITY: You and/or your care partner have signed paperwork which will be entered into your electronic medical record.  These signatures attest to the fact that that the  information above on your After Visit Summary has been reviewed and is understood.  Full responsibility of the confidentiality of this discharge information lies with you and/or your care-partner.

## 2019-09-12 NOTE — Progress Notes (Signed)
Vs: JB Temp [place]  I have reviewed the patient's medical history in detail and updated the computerized patient record.

## 2019-09-12 NOTE — Progress Notes (Signed)
Report given to PACU, vss 

## 2019-09-12 NOTE — Progress Notes (Signed)
Called to room to assist during endoscopic procedure.  Patient ID and intended procedure confirmed with present staff. Received instructions for my participation in the procedure from the performing physician.  

## 2019-09-12 NOTE — Op Note (Signed)
[place] Patient Name: Eric Vazquez Procedure Date: 09/12/2019 8:30 AM MRN: [id number] Endoscopist: Gatha Mayer , MD Age: 58 Referring MD:  Date of Birth: 02-12-1961 Gender: Male Account #: [id number] Procedure:                Colonoscopy Indications:              Screening for colorectal malignant neoplasm Medicines:                Propofol per Anesthesia, Monitored Anesthesia Care Procedure:                Pre-Anesthesia Assessment:                           - Prior to the procedure, a History and Physical                            was performed, and patient medications and                            allergies were reviewed. The patient's tolerance of                            previous anesthesia was also reviewed. The risks                            and benefits of the procedure and the sedation                            options and risks were discussed with the patient.                            All questions were answered, and informed consent                            was obtained. Prior Anticoagulants: The patient has                            taken no previous anticoagulant or antiplatelet                            agents. ASA Grade Assessment: II - A patient with                            mild systemic disease. After reviewing the risks                            and benefits, the patient was deemed in                            satisfactory condition to undergo the procedure.                           After obtaining informed consent, the colonoscope  was passed under direct vision. Throughout the                            procedure, the patient's blood pressure, pulse, and                            oxygen saturations were monitored continuously. The                            Colonoscope was introduced through the anus and                            advanced to the the cecum, identified by   appendiceal orifice and ileocecal valve. The                            colonoscopy was performed without difficulty. The                            patient tolerated the procedure well. The quality                            of the bowel preparation was excellent. The bowel                            preparation used was Miralax via split dose                            instruction. The ileocecal valve, appendiceal                            orifice, and rectum were photographed. Scope In: 8:42:31 AM Scope Out: 9:01:29 AM Scope Withdrawal Time: 0 hours 14 minutes 14 seconds  Total Procedure Duration: 0 hours 18 minutes 58 seconds  Findings:                 The perianal and digital rectal examinations were                            normal. Pertinent negatives include normal prostate                            (size, shape, and consistency).                           A 2 to 3 mm polyp was found in the distal sigmoid                            colon. The polyp was sessile. The polyp was removed                            with a cold snare. Resection and retrieval were  complete. Verification of patient identification                            for the specimen was done. Estimated blood loss was                            minimal.                           The exam was otherwise without abnormality on                            direct and retroflexion views. Complications:            No immediate complications. Estimated Blood Loss:     Estimated blood loss was minimal. Impression:               - One 2 to 3 mm polyp in the distal sigmoid colon,                            removed with a cold snare. Resected and retrieved.                           - The examination was otherwise normal on direct                            and retroflexion views. Recommendation:           - Patient has a contact number available for                            emergencies. The signs  and symptoms of potential                            delayed complications were discussed with the                            patient. Return to normal activities tomorrow.                            Written discharge instructions were provided to the                            patient.                           - Resume previous diet.                           - Continue present medications.                           - Repeat colonoscopy is recommended. The                            colonoscopy date will be determined after pathology  results from today's exam become available for                            review. Gatha Mayer, MD 09/12/2019 9:06:51 AM This report has been signed electronically.

## 2019-09-16 ENCOUNTER — Telehealth: Payer: Self-pay

## 2019-09-16 ENCOUNTER — Telehealth: Payer: Self-pay | Admitting: *Deleted

## 2019-09-16 NOTE — Telephone Encounter (Signed)
First follow up call attempt.  Message left on vm. 

## 2019-09-16 NOTE — Telephone Encounter (Signed)
Second follow up call, no answer, LM 

## 2019-09-25 ENCOUNTER — Encounter: Payer: Self-pay | Admitting: Internal Medicine

## 2019-09-25 DIAGNOSIS — Z860101 Personal history of adenomatous and serrated colon polyps: Secondary | ICD-10-CM

## 2019-09-25 DIAGNOSIS — Z86010 Personal history of colonic polyps: Secondary | ICD-10-CM

## 2019-09-25 HISTORY — DX: Personal history of adenomatous and serrated colon polyps: Z86.0101

## 2019-09-25 HISTORY — DX: Personal history of colonic polyps: Z86.010

## 2019-09-26 ENCOUNTER — Ambulatory Visit (INDEPENDENT_AMBULATORY_CARE_PROVIDER_SITE_OTHER): Payer: BC Managed Care – PPO | Admitting: Family Medicine

## 2019-09-26 ENCOUNTER — Other Ambulatory Visit: Payer: Self-pay

## 2019-09-26 ENCOUNTER — Encounter: Payer: Self-pay | Admitting: Family Medicine

## 2019-09-26 VITALS — BP 130/80 | HR 60 | Temp 98.6°F | Ht 73.0 in | Wt 216.0 lb

## 2019-09-26 DIAGNOSIS — E039 Hypothyroidism, unspecified: Secondary | ICD-10-CM | POA: Diagnosis not present

## 2019-09-26 DIAGNOSIS — K219 Gastro-esophageal reflux disease without esophagitis: Secondary | ICD-10-CM

## 2019-09-26 NOTE — Progress Notes (Signed)
Phone [phone] In person visit   Subjective:   Eric Vazquez is a 58 y.o. year old very pleasant male patient who presents for/with See problem oriented charting Chief Complaint  Patient presents with  . Follow-up    ROS- Review of Systems  Constitutional: Negative.   HENT: Negative.   Eyes: Negative.   Respiratory: Negative.   Cardiovascular: Negative.   Gastrointestinal: Positive for heartburn.       Has had improvement with medications   Genitourinary: Negative.   Musculoskeletal: Negative.   Skin: Negative.   Neurological: Negative.   Endo/Heme/Allergies: Negative.   Psychiatric/Behavioral: Negative.  Negative for depression.   This visit occurred during the SARS-CoV-2 public health emergency.  Safety protocols were in place, including screening questions prior to the visit, additional usage of staff PPE, and extensive cleaning of exam room while observing appropriate contact time as indicated for disinfecting solutions.   Past Medical History-  Patient Active Problem List   Diagnosis Date Noted  . Hx of adenomatous polyp of colon 09/25/2019    Priority: Medium  . Vitamin D deficiency 12/10/2015    Priority: Medium  . Depression, controlled 09/10/2015    Priority: Medium  . Pernicious anemia 03/26/2015    Priority: Medium  . Hypothyroidism 12/20/2010    Priority: Medium  . Rhinitis, allergic 08/19/2019    Priority: Low  . Laryngopharyngeal reflux (LPR) 12/14/2017    Priority: Low  . Left hip flexor tightness 03/17/2016    Priority: Low  . Nonallopathic lesion of lumbosacral region 03/17/2016    Priority: Low  . Nonallopathic lesion of thoracic region 03/17/2016    Priority: Low  . Nonallopathic lesion of sacral region 03/17/2016    Priority: Low  . GERD (gastroesophageal reflux disease)     Priority: Low  . Thrombocytopenia ([place]) 03/30/2015    Priority: Low  . 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 Actinic keratosis 03/26/2015   Priority: Low  . Nasal septal deviation 12/14/2017  . Post-nasal drainage 12/14/2017  . Anxiety 11/20/2014    Medications- reviewed and updated Current Outpatient Medications  Medication Sig Dispense Refill  . albuterol (PROVENTIL HFA;VENTOLIN HFA) 108 (90 Base) MCG/ACT inhaler Inhale 2 puffs into the lungs every 6 (six) hours as needed for wheezing or shortness of breath. 1 Inhaler 0  . ALPRAZolam (XANAX) 0.25 MG tablet Take by mouth.    Marland Kitchen aspirin 81 MG chewable tablet Chew 81 mg by mouth daily.    . cholecalciferol (VITAMIN D3) 25 MCG (1000 UT) tablet Take 1,000 Units by mouth daily.    . cyanocobalamin (,VITAMIN B-12,) 1000 MCG/ML injection INJECT 1 ML INTO THE MUSCLE EVERY 14 DAYS 10 mL 3  . folic acid (FOLVITE) 1 MG tablet TAKE 1 TABLET(1 MG) BY MOUTH DAILY 90 tablet 1  . omeprazole (PRILOSEC) 20 MG capsule Take 20 mg by mouth daily.    . Polyethylene Glycol 3350 (MIRALAX PO) Take by mouth once. 238 grams for colon prep 12-4 with dulcolax 5 mg tabs x 4    . SYNTHROID 175 MCG tablet TAKE 1 TABLET(175 MCG) BY MOUTH DAILY BEFORE BREAKFAST 91 tablet 3  . Syringe/Needle, Disp, (SYRINGE 3CC/25GX1") 25G X 1" 3 ML MISC INJECT 1 ML INTO THE MUSCLE EVERY 14 DAYS 50 each 0  . Syringe/Needle, Disp, (SYRINGE 3CC/25GX1-1/2") 25G X 1-1/2" 3 ML MISC Use to administer B12 injection every 14 days or as directed. 100 each 1  . TURMERIC PO Take by mouth.  No current facility-administered medications for this visit.     Objective:  BP 130/80   Pulse 60   Temp 98.6 F (37 C) (Temporal)   Ht 6\' 1"  (1.854 m)   Wt 216 lb (98 kg)   SpO2 98%   BMI 28.50 kg/m  Gen: NAD, resting comfortably CV: RRR no murmurs rubs or gallops Lungs: CTAB no crackles, wheeze, rhonchi Skin: warm, dry, no obvious rash Neuro: grossly normal, moves all extremities      Assessment and Plan   #hypothyroidism S: compliant On thyroid medication- synthroid 175 mcg. No abnormal fatigue Lab Results  Component Value  Date   TSH 2.61 12/20/2018   A/P: appears well controlled- we will defer TSH to physical  # GERD/Substernal chest pain S: Patient seen Dr. Jonni Sanger in early October for substernal chest pain. Was occurring when driving home from work- end of high stress day, minimal water intake as wearing n95.  Patient trialed omeprazole after that visit and symptoms completely resolved.  He did not follow through with stress test as a result.  He has had no ongoing symptoms since that time. Was never exertional- walking and no issues.  A/P: doing well- continue to monitor- if recurrent we can reorder stress test (difficult to get set up as required covid test and was hard to get phone calls processed right now)   # Prior depressed mood- he has been doing ok with this lately. Has been working a lot of hours but seems to be maintaining ok with this.   Recommended follow up: We opted to hold off on repeating labs- schedule physical 4-6 months and update labs at that time  Lab/Order associations:   ICD-10-CM   1. Hypothyroidism, unspecified type  E03.9   2. Gastroesophageal reflux disease without esophagitis  K21.9     Return precautions advised.  [name], MD

## 2019-09-26 NOTE — Patient Instructions (Addendum)
Glad chest pain resolved. I agree with continuing omeprazole. If recurrent or ever exertional- lets get that stress test  We opted to hold off on repeating labs- schedule physical 4-6 months and update labs at that time

## 2019-12-14 ENCOUNTER — Other Ambulatory Visit: Payer: Self-pay | Admitting: Family Medicine

## 2020-01-11 ENCOUNTER — Other Ambulatory Visit: Payer: Self-pay | Admitting: Family Medicine

## 2020-03-01 ENCOUNTER — Other Ambulatory Visit: Payer: Self-pay

## 2020-03-01 MED ORDER — FOLIC ACID 1 MG PO TABS: ORAL_TABLET | ORAL | 1 refills | Status: DC

## 2020-03-23 ENCOUNTER — Encounter: Payer: Self-pay | Admitting: Family Medicine

## 2020-03-23 DIAGNOSIS — N486 Induration penis plastica: Secondary | ICD-10-CM | POA: Insufficient documentation

## 2020-03-23 DIAGNOSIS — N5201 Erectile dysfunction due to arterial insufficiency: Secondary | ICD-10-CM

## 2020-09-10 ENCOUNTER — Other Ambulatory Visit: Payer: Self-pay | Admitting: Family Medicine

## 2020-10-13 NOTE — Progress Notes (Signed)
Phone [phone] Virtual visit via Video note   Subjective:  Chief complaint: Chief Complaint  Patient presents with  . Cough   This visit type was conducted due to national recommendations for restrictions regarding the COVID-19 Pandemic (e.g. social distancing).  This format is felt to be most appropriate for this patient at this time balancing risks to patient and risks to population by having him in for in person visit.  No physical exam was performed (except for noted visual exam or audio findings with Telehealth visits).    Our team/I connected with Eric Vazquez at  2:40 PM EST by a video enabled telemedicine application (doxy.me or caregility through epic) and verified that I am speaking with the correct person using two identifiers.  Location patient: [facility] Location provider: [facility], office Persons participating in the virtual visit:  patient  Our team/I discussed the limitations of evaluation and management by telemedicine and the availability of in person appointments. In light of current covid-19 pandemic, patient also understands that we are trying to protect them by minimizing in office contact if at all possible.  The patient expressed consent for telemedicine visit and agreed to proceed. Patient understands insurance will be billed.   Past Medical History-  Patient Active Problem List   Diagnosis Date Noted  . Hx of adenomatous polyp of colon 09/25/2019    Priority: Medium  . Vitamin D deficiency 12/10/2015    Priority: Medium  . Depression, controlled 09/10/2015    Priority: Medium  . Pernicious anemia 03/26/2015    Priority: Medium  . Hypothyroidism 12/20/2010    Priority: Medium  . Rhinitis, allergic 08/19/2019    Priority: Low  . Laryngopharyngeal reflux (LPR) 12/14/2017    Priority: Low  . Post-nasal drainage 12/14/2017    Priority: Low  . Left hip flexor tightness 03/17/2016    Priority: Low  . Nonallopathic lesion of lumbosacral region  03/17/2016    Priority: Low  . Nonallopathic lesion of thoracic region 03/17/2016    Priority: Low  . Nonallopathic lesion of sacral region 03/17/2016    Priority: Low  . GERD (gastroesophageal reflux disease)     Priority: Low  . Thrombocytopenia ([place]) 03/30/2015    Priority: Low  . 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 Actinic keratosis 03/26/2015    Priority: Low  . Erectile dysfunction due to arterial insufficiency 03/23/2020  . Peyronie's disease 03/23/2020  . Nasal septal deviation 12/14/2017    Medications- reviewed and updated Current Outpatient Medications  Medication Sig Dispense Refill  . benzonatate (TESSALON) 100 MG capsule Take 1 capsule (100 mg total) by mouth 2 (two) times daily as needed for cough. 20 capsule 0  . albuterol (PROVENTIL HFA;VENTOLIN HFA) 108 (90 Base) MCG/ACT inhaler Inhale 2 puffs into the lungs every 6 (six) hours as needed for wheezing or shortness of breath. 1 Inhaler 0  . ALPRAZolam (XANAX) 0.25 MG tablet Take by mouth.    Marland Kitchen aspirin 81 MG chewable tablet Chew 81 mg by mouth daily.    . cholecalciferol (VITAMIN D3) 25 MCG (1000 UT) tablet Take 1,000 Units by mouth daily.    . cyanocobalamin (,VITAMIN B-12,) 1000 MCG/ML injection INJECT 1 ML INTO THE MUSCLE EVERY 14 DAYS 25 mL 3  . folic acid (FOLVITE) 1 MG tablet TAKE 1 TABLET(1 MG) BY MOUTH DAILY 90 tablet 1  . omeprazole (PRILOSEC) 20 MG capsule Take 20 mg by mouth daily.    . Polyethylene Glycol 3350 (MIRALAX PO) Take  by mouth once. 238 grams for colon prep 12-4 with dulcolax 5 mg tabs x 4    . SYNTHROID 175 MCG tablet TAKE 1 TABLET(175 MCG) BY MOUTH DAILY BEFORE BREAKFAST 91 tablet 3  . Syringe/Needle, Disp, (SYRINGE 3CC/25GX1") 25G X 1" 3 ML MISC INJECT 1 ML INTO THE MUSCLE EVERY 14 DAYS 50 each 0  . Syringe/Needle, Disp, (SYRINGE 3CC/25GX1-1/2") 25G X 1-1/2" 3 ML MISC Use to administer B12 injection every 14 days or as directed. 100 each 1  . tadalafil (CIALIS) 5 MG tablet Take 5 mg  by mouth daily as needed for erectile dysfunction.    . TURMERIC PO Take by mouth.     No current facility-administered medications for this visit.     Objective:  Afebrile.  Gen: NAD, resting comfortably Lungs: nonlabored, normal respiratory rate - intermittent cough Skin: appears dry, no obvious rash     Assessment and Plan   Cough, Congestion and Wheezing S:6 days ago - perhaps new years eve- noted slight scratchy throat. Next morning woke up with more sore throat and congestion. Didn't cough much but felt perhaps chest congestion. Also had nasal congestion. No fever. Had some body aches.   He has taken mucinex, delsym, ibuprofen, tylenol.   In last 2-3 days nasal congestion minimally improved, sore throat did improve. Has a deep but dry cough. Wife who is a DNP listened to his lungs and heart- no wheeze in chest but has some upper airway wheezing. No loss of taste or smell. No vomiting or diarrhea. Travelled to [place] in December- 9 days after is when he had symptoms but no known sick contacts. Wearing full PPE in office  Up to date on covid vaccination with last 07/17/20  Has not had access to testing. Willing to come by today.   Off work all week- has Saturday patients scheduled and is trying to decide whether to keep clinic  02 levels at 94-98 % thankfully  A/P:Patient with symptoms concerning for potential covid 19 vs influenza Therefore: - testing options discussed with patient - will come by office for influenza and covid 19 testing - recommended patient watch closely for shortness of breath or confusion or worsening symptoms and if those occur he should contact us immediately  -recommended patient consider purchasing pulse oximeter and if levels 94% or below persistently- seek care at the hospital  -discussed if positive technically hed be past self isolation per newer cdc guidelines as past day 5 and improvement in symptoms- he simply needs to wear a mask when out-  if around would advise n95 which he previously was already doing. He likely will cancel Saturday clinic and await test results instead - inform close contacts if positive -Hopeful results back within 48 hours of test but may take up to a week  -treat cough with tessalon to see if more effective than delsym  Recommended follow up: as needed for acute concerns- particularly if worsening symptoms  Lab/Order associations:   ICD-10-CM   1. Cough  R05.9 POCT Influenza A/B    Novel Coronavirus, NAA ([facility])  2. Head congestion  R09.81 POCT Influenza A/B    Novel Coronavirus, NAA ([facility])    Meds ordered this encounter  Medications  . benzonatate (TESSALON) 100 MG capsule    Sig: Take 1 capsule (100 mg total) by mouth 2 (two) times daily as needed for cough.    Dispense:  20 capsule    Refill:  0   Time Spent: 20 minutes of  total time (2:57 PM- 3:17 PM) was spent on the date of the encounter performing the following actions: chart review prior to seeing the patient, obtaining history, performing a medically necessary exam, counseling on the treatment plan, placing orders, and documenting in our EHR.   Return precautions advised.  Tana Conch, MD

## 2020-10-13 NOTE — Patient Instructions (Signed)
Health Maintenance Due  Topic Date Due  . COVID-19 Vaccine (2 - Pfizer risk 4-dose series) 08/07/2020   Depression screen [facility] 2/9 09/26/2019 12/20/2018 12/20/2018  Decreased Interest 0 0 0  Down, Depressed, Hopeless 0 0 0  PHQ - 2 Score 0 0 0  Altered sleeping 0 0 -  Tired, decreased energy 0 1 -  Change in appetite 0 0 -  Feeling bad or failure about yourself  0 0 -  Trouble concentrating 0 0 -  Moving slowly or fidgety/restless 0 0 -  Suicidal thoughts 0 0 -  PHQ-9 Score 0 1 -  Difficult doing work/chores Not difficult at all Not difficult at all -

## 2020-10-14 ENCOUNTER — Encounter: Payer: Self-pay | Admitting: Family Medicine

## 2020-10-14 ENCOUNTER — Telehealth (INDEPENDENT_AMBULATORY_CARE_PROVIDER_SITE_OTHER): Payer: BC Managed Care – PPO | Admitting: Family Medicine

## 2020-10-14 ENCOUNTER — Other Ambulatory Visit: Payer: Self-pay

## 2020-10-14 DIAGNOSIS — R0981 Nasal congestion: Secondary | ICD-10-CM

## 2020-10-14 DIAGNOSIS — R059 Cough, unspecified: Secondary | ICD-10-CM | POA: Diagnosis not present

## 2020-10-14 LAB — POCT INFLUENZA A/B
Influenza A, POC: NEGATIVE
Influenza B, POC: NEGATIVE

## 2020-10-14 MED ORDER — BENZONATATE 100 MG PO CAPS
100.0000 mg | ORAL_CAPSULE | Freq: Two times a day (BID) | ORAL | 0 refills | Status: DC | PRN
Start: 2020-10-14 — End: 2021-07-01

## 2020-10-17 LAB — NOVEL CORONAVIRUS, NAA

## 2020-12-19 ENCOUNTER — Other Ambulatory Visit: Payer: Self-pay | Admitting: Family Medicine

## 2020-12-23 ENCOUNTER — Other Ambulatory Visit: Payer: Self-pay

## 2020-12-23 ENCOUNTER — Encounter: Payer: Self-pay | Admitting: Family Medicine

## 2020-12-23 MED ORDER — SYNTHROID 175 MCG PO TABS: ORAL_TABLET | ORAL | 3 refills | Status: DC

## 2021-01-17 ENCOUNTER — Encounter: Payer: Self-pay | Admitting: Family Medicine

## 2021-02-12 ENCOUNTER — Other Ambulatory Visit: Payer: Self-pay | Admitting: Family Medicine

## 2021-04-02 ENCOUNTER — Other Ambulatory Visit: Payer: Self-pay | Admitting: Family Medicine

## 2021-05-06 ENCOUNTER — Other Ambulatory Visit: Payer: Self-pay

## 2021-05-06 ENCOUNTER — Ambulatory Visit: Payer: BC Managed Care – PPO | Admitting: Physician Assistant

## 2021-05-06 ENCOUNTER — Ambulatory Visit (INDEPENDENT_AMBULATORY_CARE_PROVIDER_SITE_OTHER)
Admission: RE | Admit: 2021-05-06 | Discharge: 2021-05-06 | Disposition: A | Discharge status: Home / Self Care | Payer: BC Managed Care – PPO | Source: Ambulatory Visit | Attending: Physician Assistant | Admitting: Physician Assistant

## 2021-05-06 VITALS — BP 99/63 | HR 56 | Temp 97.3°F | Ht 73.0 in | Wt 196.2 lb

## 2021-05-06 DIAGNOSIS — M25512 Pain in left shoulder: Secondary | ICD-10-CM | POA: Diagnosis not present

## 2021-05-06 NOTE — Progress Notes (Signed)
Acute Office Visit  Subjective:    Patient ID: Eric Vazquez, male    DOB: [birth year], 60 y.o.   MRN: [id number]  Chief Complaint  Patient presents with   Shoulder Pain    HPI Patient is in today for left shoulder pain x several months. Gradually worsening to where he cannot sleep on it now. 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 No known injury. Works out at [employer] in the pool and with [place]. Having trouble tucking arm behind his back and raising overhead. No numbness or tingling. No weakness.   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 high speed suction over years   Hemorrhoids    Hx of adenomatous polyp of colon 09/25/2019   Hypothyroidism    Pernicious anemia    Thrombocytopenia (HCC)    Ulnar neuropathy at elbow of right upper extremity     Past Surgical History:  Procedure Laterality Date   COLONOSCOPY  12/2008   normal   SEPTOPLASTY  1987; 1993   with turbinate reduction, atroplasty   SHOULDER SURGERY Right 2012   labral tear   TONSILLECTOMY AND ADENOIDECTOMY  1970   tubinectomy      Family History  Problem Relation Age of Onset   Pernicious anemia Mother    Pernicious anemia Maternal Grandfather    Alcohol abuse Maternal Grandfather    Alcohol abuse Maternal Grandmother    Diabetes Maternal Grandmother    Colon cancer Paternal Uncle    Colon polyps Neg Hx    Esophageal cancer Neg Hx    Rectal cancer Neg Hx    Stomach cancer Neg Hx     Social History   Socioeconomic History   Marital status: Married    Spouse name: Not on file   Number of children: 4   Years of education: Not on file   Highest education level: Not on file  Occupational History   Occupation: dentist  Tobacco Use   Smoking status: Never   Smokeless tobacco: Never   Substance and Sexual Activity   Alcohol use: Yes    Comment: 1 weekly   Drug use: No   Sexual activity: Yes  Other Topics Concern   Not on file  Social History Narrative   Married. [name].      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 He lives with his wife & 3 sons (twins [name] and [place] at [place], older Benicia at [place], 1 son is grown [name]- to be married 2017). He is from [place], lived in [place] before relocating to [place].      Hobbies: enjoys yardwork, hiking, back packing when able   Exercises at home- walking, working out, biking.    Social Determinants of Health   Financial Resource Strain: Not on file  Food Insecurity: Not on file  Transportation Needs: Not on file  Physical Activity: Not on file  Stress: Not on file  Social Connections: Not on file  Intimate Partner Violence: Not on file    Outpatient Medications Prior to Visit  Medication Sig Dispense Refill   albuterol (PROVENTIL HFA;VENTOLIN HFA) 108 (90 Base) MCG/ACT inhaler Inhale 2 puffs into the lungs every 6 (six) hours  as needed for wheezing or shortness of breath. 1 Inhaler 0   ALPRAZolam (XANAX) 0.25 MG tablet Take by mouth.     aspirin 81 MG chewable tablet Chew 81 mg by mouth daily.     benzonatate (TESSALON) 100 MG capsule Take 1 capsule (100 mg total) by mouth 2 (two) times daily as needed for cough. 20 capsule 0   cholecalciferol (VITAMIN D3) 25 MCG (1000 UT) tablet Take 1,000 Units by mouth daily.     cyanocobalamin (,VITAMIN B-12,) 1000 MCG/ML injection INJECT 1 ML INTO THE MUSCLE EVERY 14 DAYS 25 mL 3   folic acid (FOLVITE) 1 MG tablet TAKE 1 TABLET(1 MG) BY MOUTH DAILY 90 tablet 1   Polyethylene Glycol 3350 (MIRALAX PO) Take by mouth once. 238 grams for colon prep 12-4 with dulcolax 5 mg tabs x 4     SYNTHROID 175 MCG tablet TAKE 1 TABLET(175 MCG) BY MOUTH DAILY BEFORE BREAKFAST 90 tablet 3   Syringe/Needle, Disp, (SYRINGE 3CC/25GX1") 25G X 1" 3 ML MISC INJECT 1 ML INTO THE MUSCLE  EVERY 14 DAYS 50 each 0   Syringe/Needle, Disp, (SYRINGE 3CC/25GX1-1/2") 25G X 1-1/2" 3 ML MISC Use to administer B12 injection every 14 days or as directed. 100 each 1   tadalafil (CIALIS) 5 MG tablet Take 5 mg by mouth daily as needed for erectile dysfunction.     TURMERIC PO Take by mouth.     omeprazole (PRILOSEC) 20 MG capsule Take 20 mg by mouth daily.     No facility-administered medications prior to visit.    Allergies  Allergen Reactions   Latex Other (See Comments)    Other   Penicillins     Review of Systems REFER TO HPI FOR PERTINENT POSITIVES AND NEGATIVES     Objective:    Physical Exam Musculoskeletal:     Left shoulder: No swelling, deformity, tenderness, bony tenderness or crepitus. Decreased range of motion (pain with external rotation and abduction against resistance). Normal strength. Normal pulse.    BP 99/63   Pulse (!) 56   Temp (!) 97.3 F (36.3 C)   Ht '6\' 1"'$  (1.854 m)   Wt 196 lb 4 oz (89 kg)   BMI 25.89 kg/m  Wt Readings from Last 3 Encounters:  05/06/21 196 lb 4 oz (89 kg)  09/26/19 216 lb (98 kg)  09/12/19 215 lb (97.5 kg)    Health Maintenance Due  Topic Date Due   Pneumococcal Vaccine 21-60 Years old (1 - PCV) Never done   COVID-19 Vaccine (4 - Booster for Pfizer series) 10/17/2020    There are no preventive care reminders to display for this patient.   Lab Results  Component Value Date   TSH 2.61 12/20/2018   Lab Results  Component Value Date   WBC 5.2 12/20/2018   HGB 15.6 12/20/2018   HCT 45.5 12/20/2018   MCV 90.0 12/20/2018   PLT 131.0 (L) 12/20/2018   Lab Results  Component Value Date   NA 140 12/20/2018   K 4.3 12/20/2018   CO2 30 12/20/2018   GLUCOSE 97 12/20/2018   BUN 15 12/20/2018   CREATININE 1.09 12/20/2018   BILITOT 1.0 12/20/2018   ALKPHOS 52 12/20/2018   AST 14 12/20/2018   ALT 16 12/20/2018   PROT 6.8 12/20/2018   ALBUMIN 4.6 12/20/2018   CALCIUM 9.2 12/20/2018   GFR 69.77 12/20/2018   Lab  Results  Component Value Date   CHOL 171 12/20/2018   Lab Results  Component Value Date   HDL 44.80 12/20/2018   Lab Results  Component Value Date   LDLCALC 105 (H) 12/20/2018   Lab Results  Component Value Date   TRIG 105.0 12/20/2018   Lab Results  Component Value Date   CHOLHDL 4 12/20/2018   Lab Results  Component Value Date   HGBA1C 5.5 03/26/2015       Assessment & Plan:   Problem List Items Addressed This Visit   None   1. Acute pain of left shoulder Left shoulder pain without injury. Most likely some rotator involvement. Will start with XRAY and also refer to sports medicine. He will likely need PT. He may take NSAIDs as needed with food, such as Aleve twice daily. Gentle stretches OK, but avoid any heavy lifting with this arm or overhead work at this time.   Hatsuko Bizzarro M Prateek Knipple, PA-C

## 2021-06-09 NOTE — Progress Notes (Signed)
[facility] Phone: [phone] Subjective:   Fontaine No, am serving as a scribe for Dr. Hulan Saas.  This visit occurred during the SARS-CoV-2 public health emergency.  Safety protocols were in place, including screening questions prior to the visit, additional usage of staff PPE, and extensive cleaning of exam room while observing appropriate contact time as indicated for disinfecting solutions.   I'm seeing this patient by the request  of:  Marin Olp, MD  CC: Left shoulder pain  [id number]  Eric Vazquez is a 60 y.o. male coming in with complaint of L shoulder pain. (-) L shoulder xray 05/06/2021. 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 Pain began 5 months ago. Is unable to lift arm without sharp pain. Pain over anterior and superior portion of shoulder. Uses IBU prn. Tried Celebrex which did not help. Uses turmeric as well.  Patient has been working out regularly and doing therapy exercises with minimal to no improvement.  If anything feels like he is having worsening weakness, affecting patient's work as well as sleep.  05/06/2021 Xray L shoulder IMPRESSION: Negative.        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 high speed suction over years   Hemorrhoids    Hx of adenomatous polyp of colon 09/25/2019   Hypothyroidism    Pernicious anemia    Thrombocytopenia (HCC)    Ulnar neuropathy at elbow of right upper extremity    Past Surgical History:  Procedure Laterality Date   COLONOSCOPY  12/2008   normal   SEPTOPLASTY  1987; 1993   with turbinate reduction, atroplasty   SHOULDER SURGERY Right 2012   labral tear   TONSILLECTOMY AND ADENOIDECTOMY  1970    tubinectomy     Social History   Socioeconomic History   Marital status: Married    Spouse name: Not on file   Number of children: 4   Years of education: Not on file   Highest education level: Not on file  Occupational History   Occupation: dentist  Tobacco Use   Smoking status: Never   Smokeless tobacco: Never  Substance and Sexual Activity   Alcohol use: Yes    Comment: 1 weekly   Drug use: No   Sexual activity: Yes  Other Topics Concern   Not on file  Social History Narrative   Married. [name].      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 He lives with his wife & 3 sons (twins [name] and [place] at [place], older [place] at [place], 1 son is grown [name]- to be married 2017). He is from [place], lived in [place] before relocating to [place].      Hobbies: enjoys yardwork, hiking, back packing when able   Exercises at home- walking, working out, biking.    Social Determinants of Health   Financial Resource Strain: Not on file  Food Insecurity: Not on file  Transportation Needs: Not on file  Physical Activity: Not on file  Stress: Not on file  Social Connections: Not on file   Allergies  Allergen Reactions   Latex Other (See  Comments)    Other   Penicillins    Family History  Problem Relation Age of Onset   Pernicious anemia Mother    Pernicious anemia Maternal Grandfather    Alcohol abuse Maternal Grandfather    Alcohol abuse Maternal Grandmother    Diabetes Maternal Grandmother    Colon cancer Paternal Uncle    Colon polyps Neg Hx    Esophageal cancer Neg Hx    Rectal cancer Neg Hx    Stomach cancer Neg Hx     Current Outpatient Medications (Endocrine & Metabolic):    SYNTHROID 0000000 MCG tablet, TAKE 1 TABLET(175 MCG) BY MOUTH DAILY BEFORE BREAKFAST  Current Outpatient Medications (Cardiovascular):    tadalafil (CIALIS) 5 MG tablet, Take 5 mg by mouth daily as needed for erectile dysfunction.  Current Outpatient Medications (Respiratory):     albuterol (PROVENTIL HFA;VENTOLIN HFA) 108 (90 Base) MCG/ACT inhaler, Inhale 2 puffs into the lungs every 6 (six) hours as needed for wheezing or shortness of breath.   benzonatate (TESSALON) 100 MG capsule, Take 1 capsule (100 mg total) by mouth 2 (two) times daily as needed for cough.  Current Outpatient Medications (Analgesics):    aspirin 81 MG chewable tablet, Chew 81 mg by mouth daily.  Current Outpatient Medications (Hematological):    cyanocobalamin (,VITAMIN B-12,) 1000 MCG/ML injection, INJECT 1 ML INTO THE MUSCLE EVERY 14 DAYS   folic acid (FOLVITE) 1 MG tablet, TAKE 1 TABLET(1 MG) BY MOUTH DAILY  Current Outpatient Medications (Other):    ALPRAZolam (XANAX) 0.25 MG tablet, Take by mouth.   cholecalciferol (VITAMIN D3) 25 MCG (1000 UT) tablet, Take 1,000 Units by mouth daily.   Polyethylene Glycol 3350 (MIRALAX PO), Take by mouth once. 238 grams for colon prep 12-4 with dulcolax 5 mg tabs x 4   Syringe/Needle, Disp, (SYRINGE 3CC/25GX1") 25G X 1" 3 ML MISC, INJECT 1 ML INTO THE MUSCLE EVERY 14 DAYS   Syringe/Needle, Disp, (SYRINGE 3CC/25GX1-1/2") 25G X 1-1/2" 3 ML MISC, Use to administer B12 injection every 14 days or as directed.   TURMERIC PO, Take by mouth.   Reviewed prior external information including notes and imaging from  primary care provider As well as notes that were available from care everywhere and other healthcare systems.  Past medical history, social, surgical and family history all reviewed in electronic medical record.  No pertanent information unless stated regarding to the chief complaint.   Review of Systems:  No headache, visual changes, nausea, vomiting, diarrhea, constipation, dizziness, abdominal pain, skin rash, fevers, chills, night sweats, weight loss, swollen lymph nodes, body aches, joint swelling, chest pain, shortness of breath, mood changes. POSITIVE muscle aches  Objective  Blood pressure 118/72, pulse 65, height '6\' 1"'$  (1.854 m), weight 192  lb (87.1 kg), SpO2 98 %.   General: No apparent distress alert and oriented x3 mood and affect normal, dressed appropriately.  HEENT: Pupils equal, extraocular movements intact  Respiratory: Patient's speak in full sentences and does not appear short of breath  Cardiovascular: No lower extremity edema, non tender, no erythema  Gait normal with good balance and coordination.  MSK: Left shoulder exam does show the patient has some mild decrease in internal range of motion.  Patient does have weakness with 3+ out of 5 strength of the rotator cuff with a mild positive empty can.  Patient does have positive crossover as well as severe positive O'Brien's test.  Limited muscular skeletal ultrasound was performed and interpreted by Hulan Saas, M  Limited  musculoskeletal ultrasound shows that patient supraspinatus and does not have a specific tear or any hypoechoic changes the patient does have laxity of the tendon fibers.  Subscapularis is unremarkable.  Bicep tendon does sit in a very shallow groove.  Acromioclavicular joint is unremarkable.  Significant calcific changes noted to the posterior labrum that is visualized. Impression: Findings consistent with questionable rotator cuff pathology in labral pathology.    Impression and Recommendations:     The above documentation has been reviewed and is accurate and complete Lyndal Pulley, DO

## 2021-06-10 ENCOUNTER — Ambulatory Visit: Payer: BC Managed Care – PPO | Admitting: Family Medicine

## 2021-06-10 ENCOUNTER — Encounter: Payer: Self-pay | Admitting: Family Medicine

## 2021-06-10 ENCOUNTER — Ambulatory Visit (INDEPENDENT_AMBULATORY_CARE_PROVIDER_SITE_OTHER): Payer: BC Managed Care – PPO

## 2021-06-10 ENCOUNTER — Other Ambulatory Visit: Payer: Self-pay

## 2021-06-10 ENCOUNTER — Ambulatory Visit: Payer: Self-pay

## 2021-06-10 VITALS — BP 118/72 | HR 65 | Ht 73.0 in | Wt 192.0 lb

## 2021-06-10 DIAGNOSIS — M542 Cervicalgia: Secondary | ICD-10-CM

## 2021-06-10 DIAGNOSIS — G8929 Other chronic pain: Secondary | ICD-10-CM | POA: Diagnosis not present

## 2021-06-10 DIAGNOSIS — M25512 Pain in left shoulder: Secondary | ICD-10-CM | POA: Diagnosis not present

## 2021-06-10 NOTE — Addendum Note (Signed)
Addended by: Douglass Rivers T on: 06/10/2021 12:29 PM   Modules accepted: Orders

## 2021-06-10 NOTE — Assessment & Plan Note (Signed)
Patient is having left shoulder pain.  On ultrasound today patient does have what appears to be laxity of the supraspinatus but no true tear of initiated the patient on exam noted today does have some weakness noted with 3+ out of 5 strength.  In addition to this patient does have decrease in range of motion and concern for potential labral pathology.  Patient greater than 10 years ago did have surgical intervention on the contralateral side for this.  Patient would consider it.  Due to the weakness, decreased range of motion and affecting patient's livelihood, sleep and already failing medication such as Celebrex and therapy at our new [place] center do feel that an MR arthrogram is necessary.  Depending on findings we will see if patient is a candidate for PRP versus the possibility of surgical intervention.

## 2021-06-10 NOTE — Patient Instructions (Signed)
MRA at [place] [id number] Vit D [id number] See me in 6 weeks

## 2021-06-29 NOTE — Patient Instructions (Addendum)
Health Maintenance Due  Topic Date Due   INFLUENZA VACCINE   - Done today in office. 05/09/2021   Please stop by lab before you go If you have mychart- we will send your results within 3 business days of [place] receiving them.  If you do not have mychart- we will call you about results within 5 business days of [place] receiving them.  *please also note that you will see labs on mychart as soon as they post. I will later go in and write notes on them- will say "notes from Dr. Yong Channel"  I love that you're changing to a healthier lifestyle! Please keep up the great work  Recommended follow up: Return in about 1 year (around 07/01/2022) for physical or sooner if needed.

## 2021-06-29 NOTE — Progress Notes (Signed)
Phone: [phone]   Subjective:  Patient presents today for their annual physical. Chief complaint-noted.   See problem oriented charting- ROS- full  review of systems was completed and negative  except for: left shoulder pain,recovering from viral illness covid negative in last week or two - got from grandkids  The following were reviewed and entered/updated in epic: Past Medical History:  Diagnosis Date   Allergy    Arthritis    hands- not treated    Chicken pox    GERD (gastroesophageal reflux disease)    Rosanna Randy disease    Hearing loss    L ear, has hearing aid. high speed suction over years   Hemorrhoids    Hx of adenomatous polyp of colon 09/25/2019   Hypothyroidism    Pernicious anemia    Thrombocytopenia (HCC)    Ulnar neuropathy at elbow of right upper extremity    Patient Active Problem List   Diagnosis Date Noted   Hx of adenomatous polyp of colon 09/25/2019    Priority: Medium   Vitamin D deficiency 12/10/2015    Priority: Medium   Depression, controlled 09/10/2015    Priority: Medium   Pernicious anemia 03/26/2015    Priority: Medium   Hypothyroidism 12/20/2010    Priority: Medium   Rhinitis, allergic 08/19/2019    Priority: Low   Laryngopharyngeal reflux (LPR) 12/14/2017    Priority: Low   Post-nasal drainage 12/14/2017    Priority: Low   Left hip flexor tightness 03/17/2016    Priority: Low   Nonallopathic lesion of lumbosacral region 03/17/2016    Priority: Low   Nonallopathic lesion of thoracic region 03/17/2016    Priority: Low   Nonallopathic lesion of sacral region 03/17/2016    Priority: Low   GERD (gastroesophageal reflux disease)     Priority: Low   Thrombocytopenia ([place]) 03/30/2015    Priority: Low   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    Priority: Low   Actinic keratosis 03/26/2015    Priority: Low   Left shoulder pain 06/10/2021   Erectile dysfunction due to arterial insufficiency 03/23/2020   Peyronie's disease 03/23/2020   Nasal  septal deviation 12/14/2017   Past Surgical History:  Procedure Laterality Date   COLONOSCOPY  12/2008   normal   SEPTOPLASTY  1987; 1993   with turbinate reduction, atroplasty   SHOULDER SURGERY Right 2012   labral tear   TONSILLECTOMY AND ADENOIDECTOMY  1970   tubinectomy      Family History  Problem Relation Age of Onset   Pernicious anemia Mother    Pernicious anemia Maternal Grandfather    Alcohol abuse Maternal Grandfather    Alcohol abuse Maternal Grandmother    Diabetes Maternal Grandmother    Colon cancer Paternal Uncle    Colon polyps Neg Hx    Esophageal cancer Neg Hx    Rectal cancer Neg Hx    Stomach cancer Neg Hx     Medications- reviewed and updated Current Outpatient Medications  Medication Sig Dispense Refill   cholecalciferol (VITAMIN D3) 25 MCG (1000 UT) tablet Take 2,000 Units by mouth 3 (three) times a week.     Syringe/Needle, Disp, (SYRINGE 3CC/25GX1") 25G X 1" 3 ML MISC INJECT 1 ML INTO THE MUSCLE EVERY 14 DAYS 50 each 0   Syringe/Needle, Disp, (SYRINGE 3CC/25GX1-1/2") 25G X 1-1/2" 3 ML MISC Use to administer B12 injection every 14 days or as directed. 100 each 1   tadalafil (CIALIS) 5 MG tablet Take 5 mg by mouth daily as  needed for erectile dysfunction.     TURMERIC PO Take by mouth.     cyanocobalamin (,VITAMIN B-12,) 1000 MCG/ML injection INJECT 1 ML INTO THE MUSCLE EVERY 14 DAYS 10 mL 3   folic acid (FOLVITE) 1 MG tablet Take 1 tablet (1 mg total) by mouth daily. 90 tablet 3   SYNTHROID 175 MCG tablet TAKE 1 TABLET(175 MCG) BY MOUTH DAILY BEFORE BREAKFAST 90 tablet 3   No current facility-administered medications for this visit.    Allergies-reviewed and updated Allergies  Allergen Reactions   Latex Other (See Comments)    Other   Penicillins     Social History   Social History Narrative   Married. [name].      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 He lives with his wife & 3 sons (twins [name] and  [place] at [place], older [place] at [place], 1 son is grown [name]- to be married 2017). He is from [place], lived in [place] before relocating to [place].      Hobbies: enjoys yardwork, hiking, back packing when able   Exercises at home- walking, working out, biking.    Objective  Objective:  BP 124/74   Pulse (!) 59   Temp 97.8 F (36.6 C) (Temporal)   Ht 6\' 1"  (1.854 m)   Wt 192 lb (87.1 kg)   SpO2 98%   BMI 25.33 kg/m  Gen: NAD, resting comfortably HEENT: Mucous membranes are moist. Oropharynx normal Neck: no thyromegaly CV: RRR no murmurs rubs or gallops Lungs: CTAB no crackles, wheeze, rhonchi Abdomen: soft/nontender/nondistended/normal bowel sounds. No rebound or guarding.  Ext: no edema Skin: warm, dry Neuro: grossly normal, moves all extremities, PERRLA   Assessment and Plan  60 y.o. male presenting for annual physical.  Health Maintenance counseling: 1. Anticipatory guidance: Patient counseled regarding regular dental exams -q6 months, eye exams - yearly,  avoiding smoking and second hand smoke , limiting alcohol to 2 beverages per day - 2 per week. No illicit drugs 2. Risk factor reduction:  Advised patient of need for regular exercise and diet rich and fruits and vegetables to reduce risk of heart attack and stroke. Exercise- doing [employer] and enjoying it- walking and legs with shoulder bothering him- even swimming bothered him  Diet--did a diet through 99.5 gso weight loss and had huge success- has learned to eat more sensibly- cut out breads and sugars Wt Readings from Last 3 Encounters:  07/01/21 192 lb (87.1 kg)  06/10/21 192 lb (87.1 kg)  05/06/21 196 lb 4 oz (89 kg)  3. Immunizations/screenings/ancillary studies DISCUSSED:  -COVID vaccination repeat planned - tested negative even when family had covid. Declines omicron booster for now -Flu vaccination (last one 09/21) - just had this Immunization History  Administered Date(s) Administered   Influenza Inj Mdck Quad Pf 06/22/2018    Influenza Split 06/25/2013, 07/02/2014   Influenza,inj,Quad PF,6+ Mos 06/02/2016, 06/15/2019, 07/01/2021   Influenza-Unspecified 06/26/2015, 07/09/2017, 06/13/2020   PFIZER(Purple Top)SARS-COV-2 Vaccination 10/17/2019, 11/04/2019, 07/17/2020   PPD Test 03/26/2015   Td 07/03/2003, 07/02/2013   Tdap 03/26/2015   Zoster Recombinat (Shingrix) 03/18/2021, 05/26/2021   4. Prostate cancer screening-  screened with urology - no changes with peyronie's lately  5. Colon cancer screening - colonoscopy on 09/12/2019 -with 10-year repeat planned 6. Skin cancer screening- [facility] dermatology. advised regular sunscreen use. Denies worrisome, changing, or new skin lesions.  7. Smoking associated screening (lung cancer screening, AAA screen 65-75, UA)- Never smoker 8. STD screening -  only active with wife  Status of chronic or acute concerns   Monitor gilberts with labs- cmp today   Watch thrombocytopenia with labs- cbc today   # Depression- reasonably controlled. Contact with oldest son [name] has improved and doing much better recently.  Slowly improving- tough situation with wife- twin boys Depression screen [facility] 2/9 07/01/2021 09/26/2019 12/20/2018  Decreased Interest 0 0 0  Down, Depressed, Hopeless 0 0 0  PHQ - 2 Score 0 0 0  Altered sleeping - 0 0  Tired, decreased energy - 0 1  Change in appetite - 0 0  Feeling bad or failure about yourself  - 0 0  Trouble concentrating - 0 0  Moving slowly or fidgety/restless - 0 0  Suicidal thoughts - 0 0  PHQ-9 Score - 0 1  Difficult doing work/chores - Not difficult at all Not difficult at all   # B12 deficiency/pernicious anemia S: Current treatment/medication (oral vs. IM): 1000 mcg every 2 weeks  Lab Results  Component Value Date   VITAMINB12 509 12/20/2018   A/P: hopefully stable- update b12 today. Continue current meds for now   # left shoulder pain- working with Dr. Tamala Julian- MRI upcoming on Thursday or Friday of next week. Has already had neck  x-rays largely reassuring. Has tried celebrex/turmeric.   #hypothyroidism S: compliant On thyroid medication-synthroid 175 mcg. No abnormal fatigue  Lab Results  Component Value Date   TSH 2.61 12/20/2018   A/P:hopefully stable- update TSH today. Continue current meds for now   # GERD/Substernal chest pain - no ongoing issues- evaluation with Dr. Jonni Sanger last year. Snoring resolved as well- reflux and gerd likely weight related.   #Vitamin D deficiency S: Medication: 2000 units a day 3x a week Last vitamin D Lab Results  Component Value Date   VD25OH 32.80 12/20/2018  A/P: hopefully stable- update vitamin D today. Continue current meds for now   #hyperlipidemia S: Medication:no rx  Lab Results  Component Value Date   CHOL 171 12/20/2018   HDL 44.80 12/20/2018   LDLCALC 105 (H) 12/20/2018   TRIG 105.0 12/20/2018   CHOLHDL 4 12/20/2018   A/P: anticipating improvement with diet changes- drastically cut fats. No family history CAD. Calculate ascvd risk- if above 7.5% consider coronary artery calcium scoring  Recommended follow up: Return in about 1 year (around 07/01/2022) for physical or sooner if needed. Future Appointments  Date Time Provider [place]  07/08/2021  2:45 PM GI-315 DG C-ARM RM 3 GI-315DG GI-315 W. WE  07/08/2021  3:20 PM GI-315 MR 1 GI-315MRI GI-315 W. WE  07/29/2021  8:00 AM Lyndal Pulley, DO [facility] None   Lab/Order associations:NOT fasting   ICD-10-CM   1. Preventative health care  Z00.00 CBC with Differential/Platelet    Comprehensive metabolic panel    Lipid panel    TSH    Vitamin B12    VITAMIN D 25 Hydroxy (Vit-D Deficiency, Fractures)    2. Gastroesophageal reflux disease with esophagitis, unspecified whether hemorrhage  K21.00     3. Need for immunization against influenza  Z23 Flu Vaccine QUAD 72mo+IM (Fluarix, Fluzone & Alfiuria Quad PF)    4. Vitamin D deficiency  E55.9 VITAMIN D 25 Hydroxy (Vit-D Deficiency, Fractures)    5.  Hypothyroidism, unspecified type  E03.9 TSH    6. Pernicious anemia  D51.0 Vitamin B12    7. Mild hyperlipidemia  E78.5 CBC with Differential/Platelet    Comprehensive metabolic panel    Lipid panel  Meds ordered this encounter  Medications   cyanocobalamin (,VITAMIN B-12,) 1000 MCG/ML injection    Sig: INJECT 1 ML INTO THE MUSCLE EVERY 14 DAYS    Dispense:  10 mL    Refill:  3    Please provide 3 month supply. I am ok with 7 ml but rounded up to vial size of 10 mL   folic acid (FOLVITE) 1 MG tablet    Sig: Take 1 tablet (1 mg total) by mouth daily.    Dispense:  90 tablet    Refill:  3   SYNTHROID 175 MCG tablet    Sig: TAKE 1 TABLET(175 MCG) BY MOUTH DAILY BEFORE BREAKFAST    Dispense:  90 tablet    Refill:  3    I,Jada Bradford,acting as a scribe for [name], MD.,have documented all relevant documentation on the behalf of [name], MD,as directed by  [name], MD while in the presence of [name], MD.  I, [name], MD, have reviewed all documentation for this visit. The documentation on 07/01/21 for the exam, diagnosis, procedures, and orders are all accurate and complete.  Return precautions advised.  [name], MD

## 2021-07-01 ENCOUNTER — Ambulatory Visit (INDEPENDENT_AMBULATORY_CARE_PROVIDER_SITE_OTHER): Payer: BC Managed Care – PPO | Admitting: Family Medicine

## 2021-07-01 ENCOUNTER — Other Ambulatory Visit: Payer: Self-pay

## 2021-07-01 ENCOUNTER — Encounter: Payer: Self-pay | Admitting: Family Medicine

## 2021-07-01 VITALS — BP 124/74 | HR 59 | Temp 97.8°F | Ht 73.0 in | Wt 192.0 lb

## 2021-07-01 DIAGNOSIS — E039 Hypothyroidism, unspecified: Secondary | ICD-10-CM | POA: Diagnosis not present

## 2021-07-01 DIAGNOSIS — Z0000 Encounter for general adult medical examination without abnormal findings: Secondary | ICD-10-CM | POA: Diagnosis not present

## 2021-07-01 DIAGNOSIS — E785 Hyperlipidemia, unspecified: Secondary | ICD-10-CM

## 2021-07-01 DIAGNOSIS — Z23 Encounter for immunization: Secondary | ICD-10-CM | POA: Diagnosis not present

## 2021-07-01 DIAGNOSIS — D510 Vitamin B12 deficiency anemia due to intrinsic factor deficiency: Secondary | ICD-10-CM

## 2021-07-01 DIAGNOSIS — K2100 Gastro-esophageal reflux disease with esophagitis, without bleeding: Secondary | ICD-10-CM

## 2021-07-01 DIAGNOSIS — D696 Thrombocytopenia, unspecified: Secondary | ICD-10-CM

## 2021-07-01 DIAGNOSIS — E559 Vitamin D deficiency, unspecified: Secondary | ICD-10-CM | POA: Diagnosis not present

## 2021-07-01 DIAGNOSIS — E0590 Thyrotoxicosis, unspecified without thyrotoxic crisis or storm: Secondary | ICD-10-CM

## 2021-07-01 DIAGNOSIS — R072 Precordial pain: Secondary | ICD-10-CM

## 2021-07-01 LAB — CBC WITH DIFFERENTIAL/PLATELET
Basophils Absolute: 0 10*3/uL (ref 0.0–0.1)
Basophils Relative: 0.5 % (ref 0.0–3.0)
Eosinophils Absolute: 0.2 10*3/uL (ref 0.0–0.7)
Eosinophils Relative: 3.1 % (ref 0.0–5.0)
HCT: 43.3 % (ref 39.0–52.0)
Hemoglobin: 14.5 g/dL (ref 13.0–17.0)
Lymphocytes Relative: 26.3 % (ref 12.0–46.0)
Lymphs Abs: 1.5 10*3/uL (ref 0.7–4.0)
MCHC: 33.5 g/dL (ref 30.0–36.0)
MCV: 89.6 fl (ref 78.0–100.0)
Monocytes Absolute: 0.5 10*3/uL (ref 0.1–1.0)
Monocytes Relative: 9.5 % (ref 3.0–12.0)
Neutro Abs: 3.5 10*3/uL (ref 1.4–7.7)
Neutrophils Relative %: 60.6 % (ref 43.0–77.0)
Platelets: 134 10*3/uL — ABNORMAL LOW (ref 150.0–400.0)
RBC: 4.84 Mil/uL (ref 4.22–5.81)
RDW: 12.8 % (ref 11.5–15.5)
WBC: 5.7 10*3/uL (ref 4.0–10.5)

## 2021-07-01 LAB — COMPREHENSIVE METABOLIC PANEL
ALT: 11 U/L (ref 0–53)
AST: 12 U/L (ref 0–37)
Albumin: 4.1 g/dL (ref 3.5–5.2)
Alkaline Phosphatase: 61 U/L (ref 39–117)
BUN: 22 mg/dL (ref 6–23)
CO2: 31 mEq/L (ref 19–32)
Calcium: 9.1 mg/dL (ref 8.4–10.5)
Chloride: 103 mEq/L (ref 96–112)
Creatinine, Ser: 0.9 mg/dL (ref 0.40–1.50)
GFR: 93.58 mL/min (ref >60.00)
Glucose, Bld: 93 mg/dL (ref 70–99)
Potassium: 4.5 mEq/L (ref 3.5–5.1)
Sodium: 141 mEq/L (ref 135–145)
Total Bilirubin: 0.8 mg/dL (ref 0.2–1.2)
Total Protein: 6.6 g/dL (ref 6.0–8.3)

## 2021-07-01 LAB — LIPID PANEL
Cholesterol: 157 mg/dL (ref 0–200)
HDL: 35 mg/dL — ABNORMAL LOW (ref >39.00)
LDL Cholesterol: 102 mg/dL — ABNORMAL HIGH (ref 0–99)
NonHDL: 121.84
Total CHOL/HDL Ratio: 4
Triglycerides: 101 mg/dL (ref 0.0–149.0)
VLDL: 20.2 mg/dL (ref 0.0–40.0)

## 2021-07-01 LAB — VITAMIN D 25 HYDROXY (VIT D DEFICIENCY, FRACTURES): VITD: 31.89 ng/mL (ref 30.00–100.00)

## 2021-07-01 LAB — VITAMIN B12: Vitamin B-12: 602 pg/mL (ref 211–911)

## 2021-07-01 LAB — TSH: TSH: 0.84 u[IU]/mL (ref 0.35–5.50)

## 2021-07-01 MED ORDER — SYNTHROID 175 MCG PO TABS: ORAL_TABLET | ORAL | 3 refills | Status: DC

## 2021-07-01 MED ORDER — CYANOCOBALAMIN 1000 MCG/ML IJ SOLN: INTRAMUSCULAR | 3 refills | Status: DC

## 2021-07-01 MED ORDER — FOLIC ACID 1 MG PO TABS: 1.0000 mg | ORAL_TABLET | Freq: Every day | ORAL | 3 refills | Status: DC

## 2021-07-08 ENCOUNTER — Ambulatory Visit
Admission: RE | Admit: 2021-07-08 | Discharge: 2021-07-08 | Disposition: A | Discharge status: Home / Self Care | Payer: BC Managed Care – PPO | Source: Ambulatory Visit | Attending: Family Medicine | Admitting: Family Medicine

## 2021-07-08 ENCOUNTER — Other Ambulatory Visit: Payer: Self-pay

## 2021-07-08 DIAGNOSIS — M25512 Pain in left shoulder: Secondary | ICD-10-CM

## 2021-07-08 DIAGNOSIS — G8929 Other chronic pain: Secondary | ICD-10-CM

## 2021-07-08 MED ORDER — IOPAMIDOL (ISOVUE-M 200) INJECTION 41%
12.0000 mL | Freq: Once | INTRAMUSCULAR | Status: AC
  Administered 2021-07-08: 12 mL via INTRA_ARTICULAR

## 2021-07-11 ENCOUNTER — Encounter: Payer: Self-pay | Admitting: Family Medicine

## 2021-07-12 NOTE — Telephone Encounter (Signed)
Left message for patient to call back to schedule.  °

## 2021-07-14 NOTE — Progress Notes (Signed)
[facility] [place] [address] [place] Phone: [phone] Subjective:   IVilma Meckel, am serving as a scribe for Dr. Hulan Saas. This visit occurred during the SARS-CoV-2 public health emergency.  Safety protocols were in place, including screening questions prior to the visit, additional usage of staff PPE, and extensive cleaning of exam room while observing appropriate contact time as indicated for disinfecting solutions.   I'm seeing this patient by the request  of:  Marin Olp, MD  CC: Left shoulder pain follow-up  EYC:[id number]  06/10/2021 Patient is having left shoulder pain.  On ultrasound today patient does have what appears to be laxity of the supraspinatus but no true tear of initiated the patient on exam noted today does have some weakness noted with 3+ out of 5 strength.  In addition to this patient does have decrease in range of motion and concern for potential labral pathology.  Patient greater than 10 years ago did have surgical intervention on the contralateral side for this.  Patient would consider it.  Due to the weakness, decreased range of motion and affecting patient's livelihood, sleep and already failing medication such as Celebrex and therapy at our new [place] center do feel that an MR arthrogram is necessary.  Depending on findings we will see if patient is a candidate for PRP versus the possibility of surgical intervention.  Update 10/7/20222 Iziah Cates is a 60 y.o. male coming in with complaint of L shoulder pain. Patient states shoulder ROM is greatly decreased. Affecting his ADLs to a high degree. Pain feels similar to when he tore his right labrum. Wants to talk injections and possible surgery consult.  07/08/2021 MRI L shoulder IMPRESSION: 1. Mild tendinosis of the supraspinatus tendon with a tiny partial-thickness articular surface tear. 2. Mild partial-thickness cartilage loss of the glenohumeral  joint. 3. Thickening of the inferior joint capsule as can be seen with adhesive capsulitis.       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 high speed suction over years   Hemorrhoids    Hx of adenomatous polyp of colon 09/25/2019   Hypothyroidism    Pernicious anemia    Thrombocytopenia (HCC)    Ulnar neuropathy at elbow of right upper extremity    Past Surgical History:  Procedure Laterality Date   COLONOSCOPY  12/2008   normal   SEPTOPLASTY  1987; 1993   with turbinate reduction, atroplasty   SHOULDER SURGERY Right 2012   labral tear   TONSILLECTOMY AND ADENOIDECTOMY  1970   tubinectomy     Social History   Socioeconomic History   Marital status: Married    Spouse name: Not on file   Number of children: 4   Years of education: Not on file   Highest education level: Not on file  Occupational History   Occupation: dentist  Tobacco Use   Smoking status: Never   Smokeless tobacco: Never  Substance and Sexual Activity   Alcohol use: Yes    Comment: 1 weekly   Drug use: No   Sexual activity: Yes  Other Topics Concern   Not on file  Social History Narrative   Married. [name].      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 He lives with his wife & 3 sons (twins [name] and [place] at [place], older [place] at [place], 1 son is grown [name]- to be married 2017). He is from [place], lived in [place] before relocating to [place].      Hobbies: enjoys yardwork, hiking, back packing when able   Exercises at home- walking, working out, biking.    Social Determinants of Health   Financial Resource Strain: Not on file  Food Insecurity: Not on file  Transportation Needs: Not on file  Physical Activity: Not on file  Stress: Not on file  Social Connections: Not on file   Allergies  Allergen Reactions    Latex Other (See Comments)    Other   Penicillins    Family History  Problem Relation Age of Onset   Pernicious anemia Mother    Pernicious anemia Maternal Grandfather    Alcohol abuse Maternal Grandfather    Alcohol abuse Maternal Grandmother    Diabetes Maternal Grandmother    Colon cancer Paternal Uncle    Colon polyps Neg Hx    Esophageal cancer Neg Hx    Rectal cancer Neg Hx    Stomach cancer Neg Hx     Current Outpatient Medications (Endocrine & Metabolic):    SYNTHROID 449 MCG tablet, TAKE 1 TABLET(175 MCG) BY MOUTH DAILY BEFORE BREAKFAST  Current Outpatient Medications (Cardiovascular):    tadalafil (CIALIS) 5 MG tablet, Take 5 mg by mouth daily as needed for erectile dysfunction.    Current Outpatient Medications (Hematological):    cyanocobalamin (,VITAMIN B-12,) 1000 MCG/ML injection, INJECT 1 ML INTO THE MUSCLE EVERY 14 DAYS   folic acid (FOLVITE) 1 MG tablet, Take 1 tablet (1 mg total) by mouth daily.  Current Outpatient Medications (Other):    cholecalciferol (VITAMIN D3) 25 MCG (1000 UT) tablet, Take 2,000 Units by mouth 3 (three) times a week.   Syringe/Needle, Disp, (SYRINGE 3CC/25GX1") 25G X 1" 3 ML MISC, INJECT 1 ML INTO THE MUSCLE EVERY 14 DAYS   Syringe/Needle, Disp, (SYRINGE 3CC/25GX1-1/2") 25G X 1-1/2" 3 ML MISC, Use to administer B12 injection every 14 days or as directed.   TURMERIC PO, Take by mouth.   Reviewed prior external information including notes and imaging from  primary care provider As well as notes that were available from care everywhere and other healthcare systems.  Past medical history, social, surgical and family history all reviewed in electronic medical record.  No pertanent information unless stated regarding to the chief complaint.   Review of Systems:  No headache, visual changes, nausea, vomiting, diarrhea, constipation, dizziness, abdominal pain, skin rash, fevers, chills, night sweats, weight loss, swollen lymph nodes,  body aches, joint swelling, chest pain, shortness of breath, mood changes. POSITIVE muscle aches  Objective  Blood pressure 110/68, pulse 65, height 6\' 1"  (1.854 m), weight 191 lb (86.6 kg), SpO2 98 %.   General: No apparent distress alert and oriented x3 mood and affect normal, dressed appropriately.  HEENT: Pupils equal, extraocular movements intact  Respiratory: Patient's speak in full sentences and does not appear short of breath  Cardiovascular: No lower extremity edema, non tender, no erythema  Gait normal with good balance and coordination.  MSK: Patient does have limited range of motion of the shoulder and internal and external range of motion.  Patient does have positive impingement noted.  Rotator cuff strength is 4 out of 5.  Procedure: Real-time Ultrasound Guided Injection of left glenohumeral joint Device: GE Logiq E  Ultrasound guided injection is preferred based studies that show increased duration, increased effect, greater accuracy, decreased procedural pain, increased response rate with ultrasound guided versus blind injection.  Verbal informed consent obtained.  Time-out conducted.  Noted no overlying erythema, induration, or other signs of local infection.  Skin prepped in a sterile fashion.  Local anesthesia: Topical Ethyl chloride.  With sterile technique and under real time ultrasound guidance:  Joint visualized.  21g 2 inch needle inserted posterior approach. Pictures taken for needle placement. Patient did have injection of 2 cc of 0.5% Marcaine, and 1cc of Kenalog 40 mg/dL. Completed without difficulty  Pain immediately resolved suggesting accurate placement of the medication.  Advised to call if fevers/chills, erythema, induration, drainage, or persistent bleeding.  Impression: Technically successful ultrasound guided injection.    Impression and Recommendations:     The above documentation has been reviewed and is accurate and complete Lyndal Pulley,  DO

## 2021-07-15 ENCOUNTER — Ambulatory Visit: Payer: BC Managed Care – PPO | Admitting: Family Medicine

## 2021-07-15 ENCOUNTER — Other Ambulatory Visit: Payer: Self-pay

## 2021-07-15 ENCOUNTER — Ambulatory Visit: Payer: Self-pay

## 2021-07-15 ENCOUNTER — Encounter: Payer: Self-pay | Admitting: Family Medicine

## 2021-07-15 VITALS — BP 110/68 | HR 65 | Ht 73.0 in | Wt 191.0 lb

## 2021-07-15 DIAGNOSIS — M25512 Pain in left shoulder: Secondary | ICD-10-CM

## 2021-07-15 NOTE — Patient Instructions (Signed)
See you again in 4-6 weeks Read on PRP Contact us if needed

## 2021-07-15 NOTE — Assessment & Plan Note (Signed)
Patient given an injection today.  Given different choices and will consider the possibility of PRP.  Patient wants to avoid any surgical intervention.  Discussed with her the potential medications that could be beneficial.  Discussed icing regimen and home exercises.  Increase activity slowly.  Follow-up with me again in 6 to 8 weeks.

## 2021-07-18 ENCOUNTER — Other Ambulatory Visit: Payer: Self-pay

## 2021-07-18 ENCOUNTER — Encounter: Payer: Self-pay | Admitting: Family Medicine

## 2021-07-18 DIAGNOSIS — M25512 Pain in left shoulder: Secondary | ICD-10-CM

## 2021-07-29 ENCOUNTER — Ambulatory Visit: Payer: BC Managed Care – PPO | Admitting: Family Medicine

## 2021-08-26 ENCOUNTER — Ambulatory Visit: Payer: BC Managed Care – PPO | Admitting: Family Medicine

## 2021-12-06 ENCOUNTER — Ambulatory Visit (INDEPENDENT_AMBULATORY_CARE_PROVIDER_SITE_OTHER): Payer: BC Managed Care – PPO | Admitting: Family

## 2021-12-06 ENCOUNTER — Encounter: Payer: Self-pay | Admitting: Family

## 2021-12-06 ENCOUNTER — Other Ambulatory Visit: Payer: Self-pay

## 2021-12-06 VITALS — BP 121/78 | HR 55 | Temp 97.7°F | Ht 73.0 in | Wt 197.0 lb

## 2021-12-06 DIAGNOSIS — J209 Acute bronchitis, unspecified: Secondary | ICD-10-CM | POA: Diagnosis not present

## 2021-12-06 MED ORDER — METHYLPREDNISOLONE ACETATE 80 MG/ML IJ SUSP
80.0000 mg | Freq: Once | INTRAMUSCULAR | Status: AC
  Administered 2021-12-06: 80 mg via INTRAMUSCULAR

## 2021-12-06 MED ORDER — AZITHROMYCIN 250 MG PO TABS: ORAL_TABLET | ORAL | 0 refills | Status: AC

## 2021-12-06 NOTE — Patient Instructions (Addendum)
It was nice to see you today!  Drink plenty of fluids. You were given a steroid injection to help suppress your bronchitis symptoms and an antibiotic was sent to your pharmacy. Try to not talk as much as possible to preserve your vocal cords!  Hope you feel better soon!   PLEASE NOTE:  If you had any lab tests please let us know if you have not heard back within a few days. You may see your results on MyChart before we have a chance to review them but we will give you a call once they are reviewed by [place]. If we ordered any referrals today, please let us know if you have not heard from their office within the next week.   Please try these tips to maintain a healthy lifestyle:  Eat most of your calories during the day when you are active. Eliminate processed foods including packaged sweets (pies, cakes, cookies), reduce intake of potatoes, white bread, white pasta, and white rice. Look for whole grain options, oat flour or almond flour.  Each meal should contain half fruits/vegetables, one quarter protein, and one quarter carbs (no bigger than a computer mouse).  Cut down on sweet beverages. This includes juice, soda, and sweet tea. Also watch fruit intake, though this is a healthier sweet option, it still contains natural sugar! Limit to 3 servings daily.  Drink at least 1 glass of water with each meal and aim for at least 8 glasses per day  Exercise at least 150 minutes every week.

## 2021-12-06 NOTE — Progress Notes (Signed)
Subjective:     Patient ID: Eric Vazquez, male    DOB: 1961/06/28, 61 y.o.   MRN: [id number]  Chief Complaint  Patient presents with   Nasal Congestion    Pt says that he had Covid, the middle to the last week in January. His symptoms got better, but began to worsen a couple of weeks ago. He denies fever, and sore throat, SOB, and Chest pain.   Hoarse   Cough    HPI: Upper Respiratory Infection: Symptoms include post nasal drip, productive cough with  yellow colored sputum, and hoarseness .  Onset of symptoms was 1 month ago, unchanged since that time. He is drinking moderate amounts of fluids. Evaluation to date: none. Treatment to date: none.  There are no preventive care reminders to display for this patient.  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 high speed suction over years   Hemorrhoids    Hx of adenomatous polyp of colon 09/25/2019   Hypothyroidism    Pernicious anemia    Thrombocytopenia (HCC)    Ulnar neuropathy at elbow of right upper extremity     Past Surgical History:  Procedure Laterality Date   COLONOSCOPY  12/2008   normal   SEPTOPLASTY  1987; 1993   with turbinate reduction, atroplasty   SHOULDER SURGERY Right 2012   labral tear   TONSILLECTOMY AND ADENOIDECTOMY  1970   tubinectomy      Outpatient Medications Prior to Visit  Medication Sig Dispense Refill   cholecalciferol (VITAMIN D3) 25 MCG (1000 UT) tablet Take 2,000 Units by mouth 3 (three) times a week.     cyanocobalamin (,VITAMIN B-12,) 1000 MCG/ML injection INJECT 1 ML INTO THE MUSCLE EVERY 14 DAYS 10 mL 3   folic acid (FOLVITE) 1 MG tablet Take 1 tablet (1 mg total) by mouth daily. 90 tablet 3   SYNTHROID 175 MCG tablet TAKE 1 TABLET(175 MCG) BY MOUTH DAILY BEFORE BREAKFAST 90 tablet 3   Syringe/Needle, Disp, (SYRINGE  3CC/25GX1") 25G X 1" 3 ML MISC INJECT 1 ML INTO THE MUSCLE EVERY 14 DAYS 50 each 0   Syringe/Needle, Disp, (SYRINGE 3CC/25GX1-1/2") 25G X 1-1/2" 3 ML MISC Use to administer B12 injection every 14 days or as directed. 100 each 1   tadalafil (CIALIS) 5 MG tablet Take 5 mg by mouth daily as needed for erectile dysfunction.     TURMERIC PO Take by mouth.     No facility-administered medications prior to visit.    Allergies  Allergen Reactions   Latex Other (See Comments)    Other   Penicillins         Objective:    Physical Exam Vitals and nursing note reviewed.  Constitutional:      General: He is not in acute distress.    Appearance: Normal appearance.  HENT:     Head: Normocephalic.     Right Ear: Tympanic membrane and ear canal normal.     Left Ear: Tympanic membrane and ear canal normal.     Mouth/Throat:     Mouth: Mucous membranes are moist.     Pharynx: Posterior oropharyngeal erythema (mild) present. No pharyngeal swelling or oropharyngeal exudate.     Tonsils: No tonsillar exudate or tonsillar abscesses.  Cardiovascular:  Rate and Rhythm: Normal rate and regular rhythm.  Pulmonary:     Effort: Pulmonary effort is normal.     Breath sounds: Normal breath sounds.  Musculoskeletal:        General: Normal range of motion.     Cervical back: Normal range of motion.  Lymphadenopathy:     Cervical: No cervical adenopathy.  Skin:    General: Skin is warm and dry.  Neurological:     Mental Status: He is alert and oriented to person, place, and time.  Psychiatric:        Mood and Affect: Mood normal.    BP 121/78    Pulse (!) 55    Temp 97.7 F (36.5 C) (Temporal)    Ht 6\' 1"  (1.854 m)    Wt 197 lb (89.4 kg)    SpO2 98%    BMI 25.99 kg/m  Wt Readings from Last 3 Encounters:  12/06/21 197 lb (89.4 kg)  07/15/21 191 lb (86.6 kg)  07/01/21 192 lb (87.1 kg)      Assessment & Plan:   Problem List Items Addressed This Visit   None Visit Diagnoses     Acute  bronchitis, unspecified organism    -  Primary   reports having covid a month ago and never fully recovered, then exposed to grandson 2 weeks ago and started having sx again, now very hoarse & tired, works as a Pharmacist, community, has to be able to work. Giving steroid injection and sending Zpack, advised on use & SE.  Relevant Medications   methylPREDNISolone acetate (DEPO-MEDROL) injection 80 mg      Meds ordered this encounter  Medications   methylPREDNISolone acetate (DEPO-MEDROL) injection 80 mg   azithromycin (ZITHROMAX) 250 MG tablet    Sig: Take 2 tablets on day 1, then 1 tablet daily on days 2 through 5    Dispense:  6 tablet    Refill:  0    Order Specific Question:   Supervising Provider    Answer:   ANDY, CAMILLE L [[id number]]

## 2021-12-29 IMAGING — MR MR SHOULDER*L* W/ CM
5 series · 40 of 40 positions shown · IV contrast (agent unspecified)
Comparison: None.

CLINICAL DATA: Left shoulder pain for 4 months.

EXAM:
MR ARTHROGRAM OF THE LEFT SHOULDER
TECHNIQUE: Multiplanar, multisequence MR imaging of the left shoulder was
performed following the administration of intra-articular contrast.
CONTRAST:  See Injection Documentation.

[Series 3: T1 fat-sat · axial · 4.0mm · 0.29mm/px · z∈[-57,+63]mm · 8 of 25 slices shown (1 of 3)]
[im 1/25]
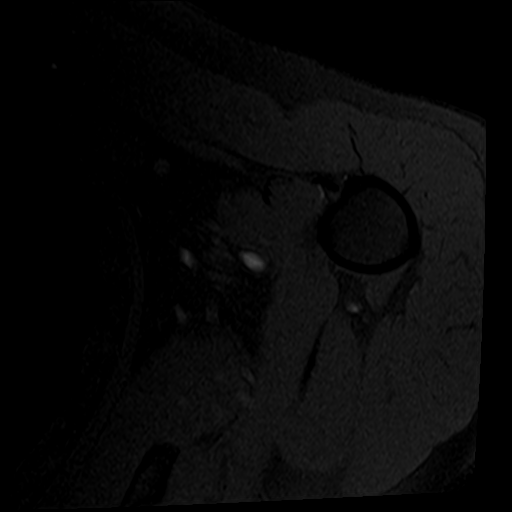
[im 4/25]
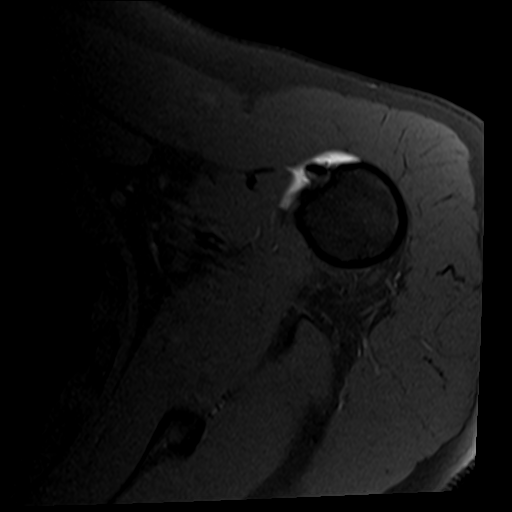
[im 7/25]
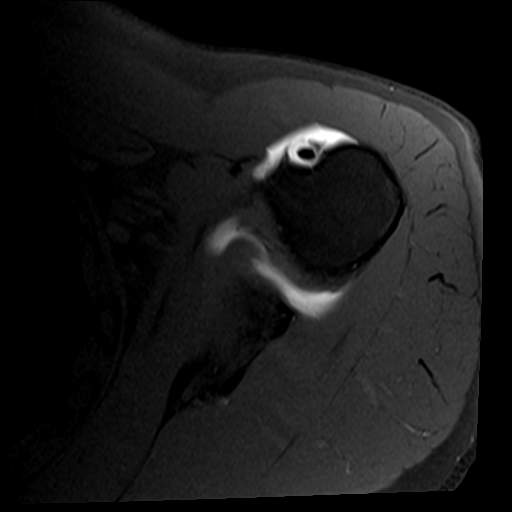
[im 11/25]
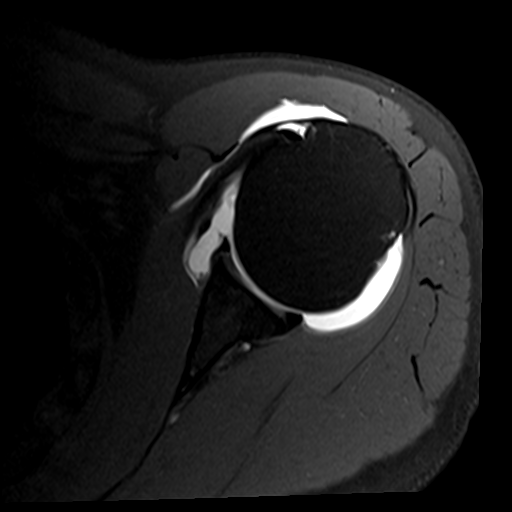
[im 14/25]
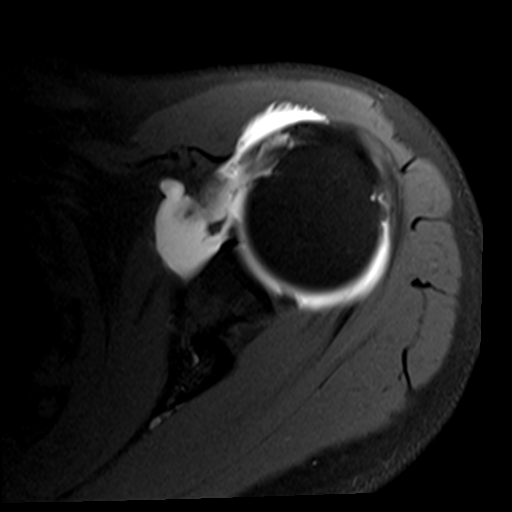
[im 18/25]
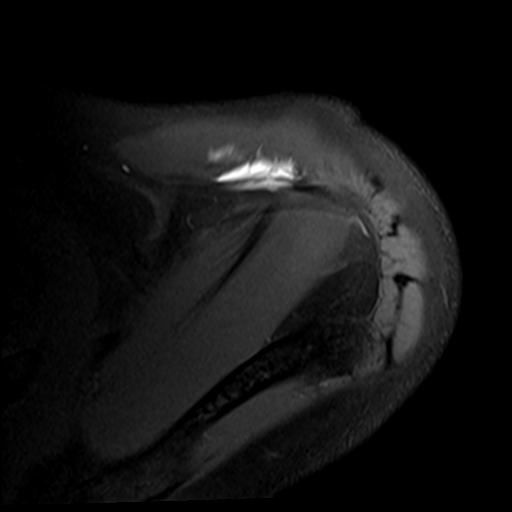
[im 21/25]
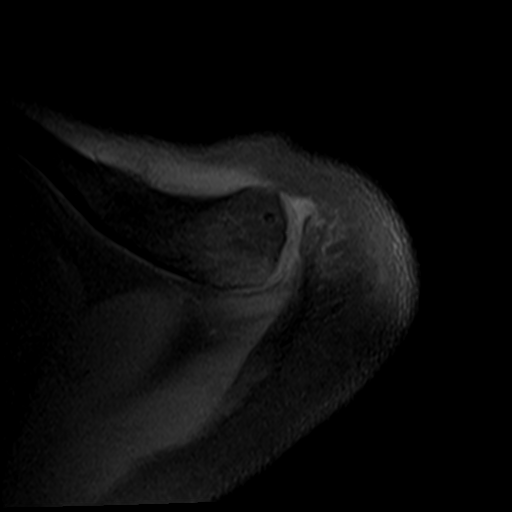
[im 25/25]
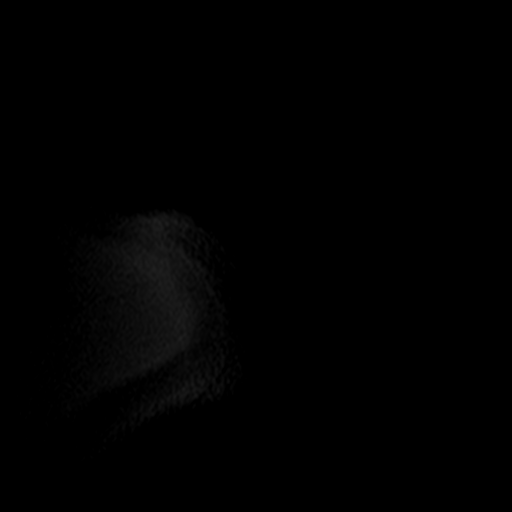

[Series 4: T2 fat-sat · oblique · 4.0mm · 0.59mm/px · 8 of 24 slices shown (1 of 2)]
[im 1/24]
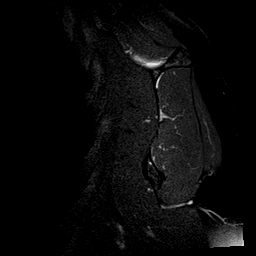
[im 4/24]
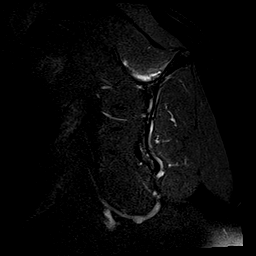
[im 7/24]
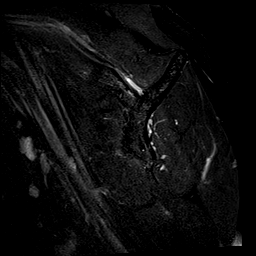
[im 10/24]
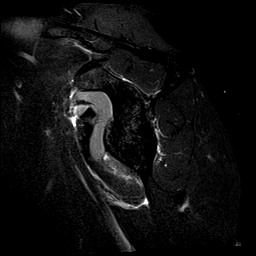
[im 14/24]
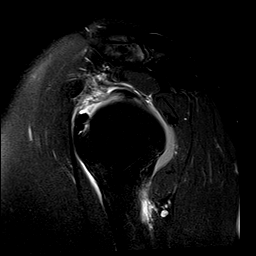
[im 17/24]
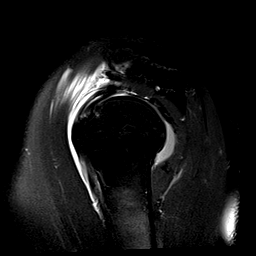
[im 20/24]
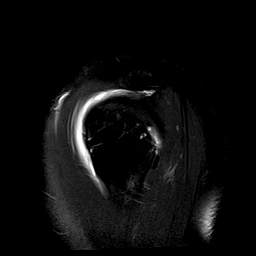
[im 24/24]
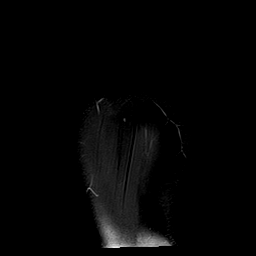

[Series 5: T1 fat-sat · oblique · 4.0mm · 0.59mm/px · 8 of 24 slices shown (2 of 3)]
[im 1/24]
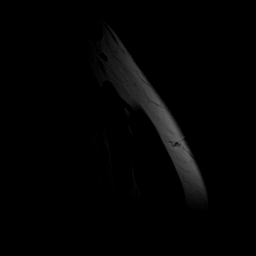
[im 4/24]
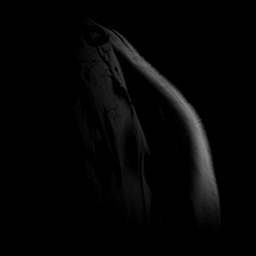
[im 7/24]
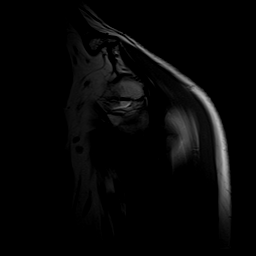
[im 10/24]
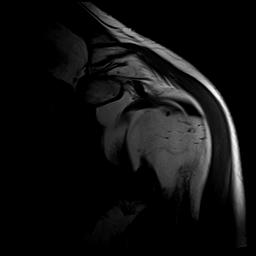
[im 14/24]
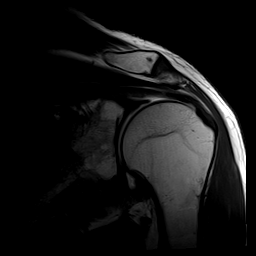
[im 17/24]
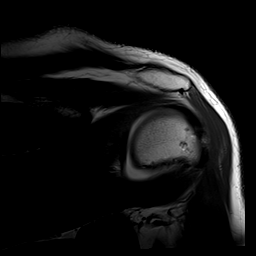
[im 20/24]
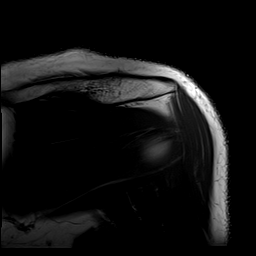
[im 24/24]
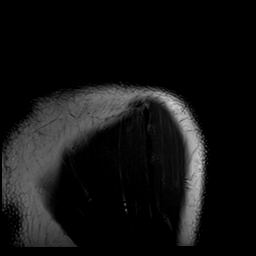

[Series 6: T1 fat-sat · oblique · 4.0mm · 0.59mm/px · 8 of 24 slices shown (3 of 3)]
[im 1/24]
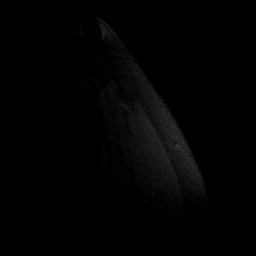
[im 4/24]
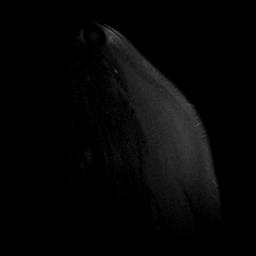
[im 7/24]
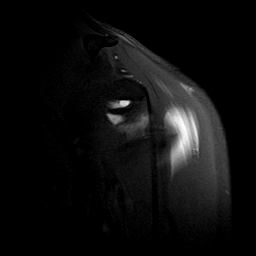
[im 10/24]
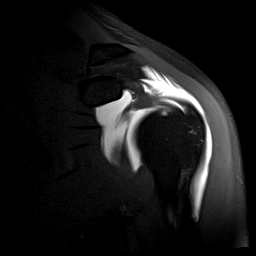
[im 14/24]
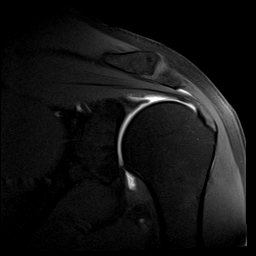
[im 17/24]
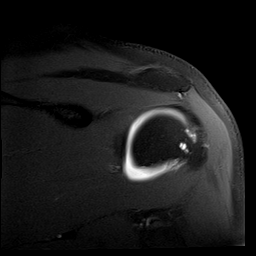
[im 20/24]
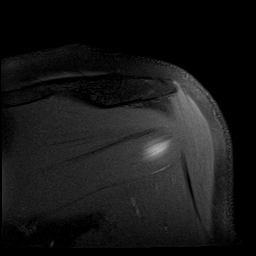
[im 24/24]
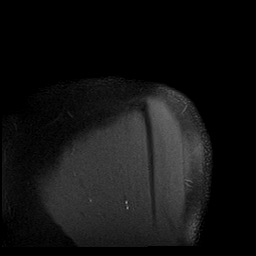

[Series 7: T2 fat-sat · oblique · 4.0mm · 0.59mm/px · 8 of 24 slices shown (2 of 2)]
[im 1/24]
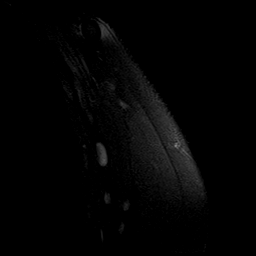
[im 4/24]
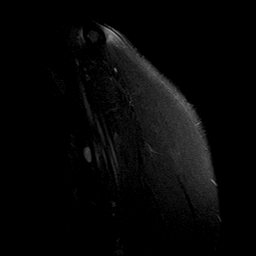
[im 7/24]
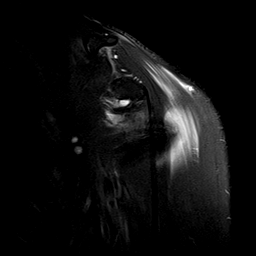
[im 10/24]
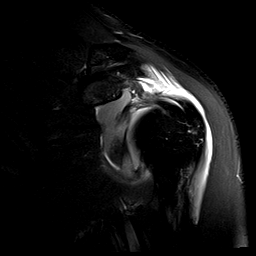
[im 14/24]
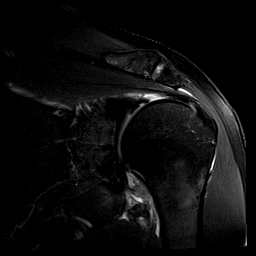
[im 17/24]
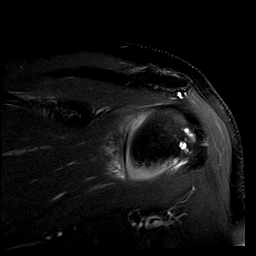
[im 20/24]
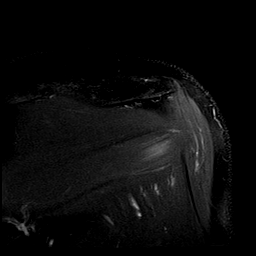
[im 24/24]
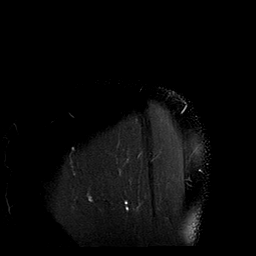

[40 of 40 positions shown; findings below may reference images not displayed]

FINDINGS: Rotator cuff: Mild tendinosis of the supraspinatus tendon with a
tiny partial-thickness articular surface tear. Infraspinatus tendon
is intact. Teres minor tendon is intact. Subscapularis tendon is
intact.

Muscles: No muscle atrophy or edema. No intramuscular fluid
collection or hematoma.

Biceps Long Head: Intraarticular and extraarticular portions of the
biceps tendon are intact.

Acromioclavicular Joint: Mild arthropathy of the acromioclavicular
joint. Small amount of contrast in the subacromial/subdeltoid bursa
likely iatrogenic from the contrast injection.

Glenohumeral Joint: Intraarticular contrast distending the joint
capsule. Normal glenohumeral ligaments. Mild partial-thickness
cartilage loss of the glenohumeral joint. Thickening of the inferior
joint capsule as can be seen with adhesive capsulitis.

Labrum: Posterosuperior labral tear.

Bones: No fracture or dislocation. No aggressive osseous lesion.

Other: No fluid collection or hematoma.
IMPRESSION: 1. Mild tendinosis of the supraspinatus tendon with a tiny
partial-thickness articular surface tear.
2. Mild partial-thickness cartilage loss of the glenohumeral joint.
3. Thickening of the inferior joint capsule as can be seen with
adhesive capsulitis.

## 2022-01-08 ENCOUNTER — Other Ambulatory Visit: Payer: Self-pay | Admitting: Family Medicine

## 2022-05-01 ENCOUNTER — Other Ambulatory Visit: Payer: Self-pay | Admitting: Family Medicine

## 2022-07-03 ENCOUNTER — Encounter: Payer: Self-pay | Admitting: *Deleted

## 2022-07-07 ENCOUNTER — Encounter: Payer: Self-pay | Admitting: Family Medicine

## 2022-07-07 ENCOUNTER — Other Ambulatory Visit: Payer: Self-pay | Admitting: Family Medicine

## 2022-07-07 ENCOUNTER — Ambulatory Visit (INDEPENDENT_AMBULATORY_CARE_PROVIDER_SITE_OTHER): Payer: BC Managed Care – PPO | Admitting: Family Medicine

## 2022-07-07 VITALS — BP 108/60 | HR 69 | Temp 97.8°F | Ht 73.0 in | Wt 208.8 lb

## 2022-07-07 DIAGNOSIS — E785 Hyperlipidemia, unspecified: Secondary | ICD-10-CM

## 2022-07-07 DIAGNOSIS — Z125 Encounter for screening for malignant neoplasm of prostate: Secondary | ICD-10-CM

## 2022-07-07 DIAGNOSIS — D696 Thrombocytopenia, unspecified: Secondary | ICD-10-CM

## 2022-07-07 DIAGNOSIS — E559 Vitamin D deficiency, unspecified: Secondary | ICD-10-CM

## 2022-07-07 DIAGNOSIS — E039 Hypothyroidism, unspecified: Secondary | ICD-10-CM | POA: Diagnosis not present

## 2022-07-07 DIAGNOSIS — Z0000 Encounter for general adult medical examination without abnormal findings: Secondary | ICD-10-CM | POA: Diagnosis not present

## 2022-07-07 DIAGNOSIS — D510 Vitamin B12 deficiency anemia due to intrinsic factor deficiency: Secondary | ICD-10-CM

## 2022-07-07 DIAGNOSIS — Z23 Encounter for immunization: Secondary | ICD-10-CM | POA: Diagnosis not present

## 2022-07-07 LAB — COMPREHENSIVE METABOLIC PANEL
ALT: 13 U/L (ref 0–53)
AST: 14 U/L (ref 0–37)
Albumin: 4.3 g/dL (ref 3.5–5.2)
Alkaline Phosphatase: 45 U/L (ref 39–117)
BUN: 21 mg/dL (ref 6–23)
CO2: 28 mEq/L (ref 19–32)
Calcium: 8.8 mg/dL (ref 8.4–10.5)
Chloride: 104 mEq/L (ref 96–112)
Creatinine, Ser: 1.1 mg/dL (ref 0.40–1.50)
GFR: 73.03 mL/min (ref >60.00)
Glucose, Bld: 86 mg/dL (ref 70–99)
Potassium: 4.4 mEq/L (ref 3.5–5.1)
Sodium: 139 mEq/L (ref 135–145)
Total Bilirubin: 1.4 mg/dL — ABNORMAL HIGH (ref 0.2–1.2)
Total Protein: 6.4 g/dL (ref 6.0–8.3)

## 2022-07-07 LAB — CBC WITH DIFFERENTIAL/PLATELET
Basophils Absolute: 0 10*3/uL (ref 0.0–0.1)
Basophils Relative: 0.7 % (ref 0.0–3.0)
Eosinophils Absolute: 0.1 10*3/uL (ref 0.0–0.7)
Eosinophils Relative: 2.5 % (ref 0.0–5.0)
HCT: 42.3 % (ref 39.0–52.0)
Hemoglobin: 14.4 g/dL (ref 13.0–17.0)
Lymphocytes Relative: 26.9 % (ref 12.0–46.0)
Lymphs Abs: 1.2 10*3/uL (ref 0.7–4.0)
MCHC: 34.2 g/dL (ref 30.0–36.0)
MCV: 90.1 fl (ref 78.0–100.0)
Monocytes Absolute: 0.5 10*3/uL (ref 0.1–1.0)
Monocytes Relative: 11.5 % (ref 3.0–12.0)
Neutro Abs: 2.7 10*3/uL (ref 1.4–7.7)
Neutrophils Relative %: 58.4 % (ref 43.0–77.0)
Platelets: 115 10*3/uL — ABNORMAL LOW (ref 150.0–400.0)
RBC: 4.69 Mil/uL (ref 4.22–5.81)
RDW: 12.9 % (ref 11.5–15.5)
WBC: 4.6 10*3/uL (ref 4.0–10.5)

## 2022-07-07 LAB — LIPID PANEL
Cholesterol: 146 mg/dL (ref 0–200)
HDL: 39.4 mg/dL (ref >39.00)
LDL Cholesterol: 88 mg/dL (ref 0–99)
NonHDL: 106.11
Total CHOL/HDL Ratio: 4
Triglycerides: 91 mg/dL (ref 0.0–149.0)
VLDL: 18.2 mg/dL (ref 0.0–40.0)

## 2022-07-07 LAB — VITAMIN D 25 HYDROXY (VIT D DEFICIENCY, FRACTURES): VITD: 34.89 ng/mL (ref 30.00–100.00)

## 2022-07-07 LAB — VITAMIN B12: Vitamin B-12: 388 pg/mL (ref 211–911)

## 2022-07-07 LAB — TSH: TSH: 0.6 u[IU]/mL (ref 0.35–5.50)

## 2022-07-07 NOTE — Progress Notes (Signed)
Phone: [phone]   Subjective:  Patient presents today for their annual physical. Chief complaint-noted.   See problem oriented charting- ROS- full  review of systems was completed and negative  Per full ROS sheet completed by patient  The following were reviewed and entered/updated in epic: Past Medical History:  Diagnosis Date   Allergy    Arthritis    hands- not treated    Chicken pox    GERD (gastroesophageal reflux disease)    Rosanna Randy disease    Hearing loss    L ear, has hearing aid. high speed suction over years   Hemorrhoids    Hx of adenomatous polyp of colon 09/25/2019   Hypothyroidism    Pernicious anemia    Thrombocytopenia (HCC)    Ulnar neuropathy at elbow of right upper extremity    Patient Active Problem List   Diagnosis Date Noted   Peyronie's disease 03/23/2020    Priority: Medium    Hx of adenomatous polyp of colon 09/25/2019    Priority: Medium    Vitamin D deficiency 12/10/2015    Priority: Medium    Depression, controlled 09/10/2015    Priority: Medium    Pernicious anemia 03/26/2015    Priority: Medium    Hypothyroidism 12/20/2010    Priority: Medium    Erectile dysfunction due to arterial insufficiency 03/23/2020    Priority: Low   Rhinitis, allergic 08/19/2019    Priority: Low   Laryngopharyngeal reflux (LPR) 12/14/2017    Priority: Low   Post-nasal drainage 12/14/2017    Priority: Low   Left hip flexor tightness 03/17/2016    Priority: Low   Nonallopathic lesion of lumbosacral region 03/17/2016    Priority: Low   Nonallopathic lesion of thoracic region 03/17/2016    Priority: Low   Nonallopathic lesion of sacral region 03/17/2016    Priority: Low   GERD (gastroesophageal reflux disease)     Priority: Low   Thrombocytopenia ([place]) 03/30/2015    Priority: Low   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   Nasal septal deviation  12/14/2017   Past Surgical History:  Procedure Laterality Date   COLONOSCOPY  12/2008   normal   SEPTOPLASTY  1987; 1993   with turbinate reduction, atroplasty   SHOULDER SURGERY Right 2012   labral tear   TONSILLECTOMY AND ADENOIDECTOMY  1970   tubinectomy      Family History  Problem Relation Age of Onset   Pernicious anemia Mother    Pernicious anemia Maternal Grandfather    Alcohol abuse Maternal Grandfather    Alcohol abuse Maternal Grandmother    Diabetes Maternal Grandmother    Colon cancer Paternal Uncle    Colon polyps Neg Hx    Esophageal cancer Neg Hx    Rectal cancer Neg Hx    Stomach cancer Neg Hx     Medications- reviewed and updated Current Outpatient Medications  Medication Sig Dispense Refill   cholecalciferol (VITAMIN D3) 25 MCG (1000 UT) tablet Take 2,000 Units by mouth 3 (three) times a week.     cyanocobalamin (,VITAMIN B-12,) 1000 MCG/ML injection INJECT 1 ML INTO THE MUSCLE EVERY 14 DAYS 25 mL 3   folic acid (FOLVITE) 1 MG tablet Take 1 tablet (1 mg total) by mouth daily. 90 tablet 3   SYNTHROID 175 MCG tablet TAKE 1 TABLET(175 MCG) BY MOUTH DAILY BEFORE BREAKFAST 90  tablet 3   Syringe/Needle, Disp, (SYRINGE 3CC/25GX1") 25G X 1" 3 ML MISC INJECT 1 ML INTO THE MUSCLE EVERY 14 DAYS 50 each 0   Syringe/Needle, Disp, (SYRINGE 3CC/25GX1-1/2") 25G X 1-1/2" 3 ML MISC Use to administer B12 injection every 14 days or as directed. 100 each 1   tadalafil (CIALIS) 5 MG tablet Take 5 mg by mouth daily as needed for erectile dysfunction.     TURMERIC PO Take by mouth.     No current facility-administered medications for this visit.    Allergies-reviewed and updated Allergies  Allergen Reactions   Latex Other (See Comments)    Other   Penicillins     Social History   Social History Narrative   Married. [name].      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 He lives with his wife & 3 sons (twins [name] and [place] at [place],  older [place] at [place], 1 son is grown [name]- to be married 2017). He is from [place], lived in [place] before relocating to [place].      Hobbies: enjoys yardwork, hiking, back packing when able   Exercises at home- walking, working out, biking.    Objective  Objective:  BP 108/60   Pulse 69   Temp 97.8 F (36.6 C)   Ht '6\' 1"'$  (1.854 m)   Wt 208 lb 12.8 oz (94.7 kg)   SpO2 98%   BMI 27.55 kg/m  Gen: NAD, resting comfortably HEENT: Mucous membranes are moist. Oropharynx normal Neck: no thyromegaly CV: RRR no murmurs rubs or gallops Lungs: CTAB no crackles, wheeze, rhonchi Abdomen: soft/nontender/nondistended/normal bowel sounds. No rebound or guarding.  Ext: no edema Skin: warm, dry Neuro: grossly normal, moves all extremities, PERRLA    Assessment and Plan  61 y.o. male presenting for annual physical.  Health Maintenance counseling: 1. Anticipatory guidance: Patient counseled regarding regular dental exams - q6 months- will have son do his dental work, eye exams -yearly,  avoiding smoking and second hand smoke , limiting alcohol to 2 beverages per day - sparing alcohol, no illicit drugs .   2. Risk factor reduction:  Advised patient of need for regular exercise and diet rich and fruits and vegetables to reduce risk of heart attack and stroke.  Exercise- [employer] plan helping shoulders and honestly everything- usually 3 days a week.  Diet/weight management-weight has gone up some but has gained some muscle mass- thinks perhaps some fat as well- may go back to prior nutrition plan to get lower (prior [place] weight loss)  Wt Readings from Last 3 Encounters:  07/07/22 208 lb 12.8 oz (94.7 kg)  12/06/21 197 lb (89.4 kg)  07/15/21 191 lb (86.6 kg)  3. Immunizations/screenings/ancillary studies- flu shot today. Holding off on newest Covid shot.  Immunization History  Administered Date(s) Administered   Influenza Inj Mdck Quad Pf 06/22/2018   Influenza Split 06/25/2013, 07/02/2014   Influenza,inj,Quad  PF,6+ Mos 06/02/2016, 06/15/2019, 07/01/2021, 07/07/2022   Influenza-Unspecified 06/26/2015, 07/09/2017, 06/13/2020   PFIZER(Purple Top)SARS-COV-2 Vaccination 10/17/2019, 11/04/2019, 07/17/2020   PPD Test 03/26/2015   Td 07/03/2003, 07/02/2013   Tdap 03/26/2015   Zoster Recombinat (Shingrix) 03/18/2021, 05/26/2021  4. Prostate cancer screening- PSA done with urology - reports low risk 5. Colon cancer screening - history adenomatous polyp 09/2019 with 7 year repeat planned 6. Skin cancer screening- [facility]- has had reassuring checks. advised regular sunscreen use. Denies worrisome, changing, or new skin lesions.  7. Smoking associated screening (lung cancer  screening, AAA screen 65-75, UA)- never smoker 8. STD screening - only active with wife  Status of chronic or acute concerns   #social update- planning trip to [place] with priest. Wife retired but helping him some at office. 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 Continue current meds for now   #Vitamin D deficiency S: Medication: 3 x a week 5000 units Last vitamin D Lab Results  Component Value Date   VD25OH 31.89 07/01/2021  A/P: hopefully stable- update vitamin D today. Continue current meds for now   # B12 deficiency/pernicious anemia S: Current treatment/medication (oral vs. IM):  1000 mcg injections q 2 weeks  Lab Results  Component Value Date   VITAMINB12 602 07/01/2021   A/P: hopefully stable- update b12 today. Continue current meds for now   #hyperlipidemia- LDL just over 100 S: Medication:none  The 10-year ASCVD risk score (Arnett DK, et al., 2019) is: 6.7% in 2023 Lab Results  Component Value Date   CHOL 157 07/01/2021   HDL 35.00 (L) 07/01/2021   LDLCALC 102 (H) 07/01/2021    TRIG 101.0 07/01/2021   CHOLHDL 4 07/01/2021   A/P: update lipids and recalculate risk- if above 7.5% consider ct calcium scoring  #Mild thrombocytopenia- rest of cell lines normal- monitor at least annually- check today  -slightly high bilirubin likely gilberts- monitor with labs  #Peyronie's disease - has seen urology  and is on tadalafil 5 mg as needed  Recommended follow up: Return in about 1 year (around 07/08/2023) for physical or sooner if needed.Schedule b4 you leave.  Lab/Order associations:NOT fasting   ICD-10-CM   1. Preventative health care  Z00.00     2. Need for immunization against influenza  Z23 Flu Vaccine QUAD 33moIM (Fluarix, Fluzone & Alfiuria Quad PF)    3. Vitamin D deficiency  E55.9 Vitamin D (25 hydroxy)    4. Pernicious anemia  D51.0 Vitamin B12    5. Hypothyroidism, unspecified type  E03.9 TSH    6. Thrombocytopenia (HCC)  D69.6 CBC with Differential/Platelet    7. Mild hyperlipidemia  E78.5 CBC with Differential/Platelet    Comprehensive metabolic panel    Lipid panel    8. Screening for prostate cancer  Z12.5       No orders of the defined types were placed in this encounter.   Return precautions advised.  SGarret Reddish MD

## 2022-07-07 NOTE — Patient Instructions (Addendum)
Please stop by lab before you go If you have mychart- we will send your results within 3 business days of [place] receiving them.  If you do not have mychart- we will call you about results within 5 business days of [place] receiving them.  *please also note that you will see labs on mychart as soon as they post. I will later go in and write notes on them- will say "notes from Dr. Yong Channel"   Thrilled you are doing so well!   Recommended follow up: Return in about 1 year (around 07/08/2023) for physical or sooner if needed.Schedule b4 you leave.

## 2022-07-30 ENCOUNTER — Other Ambulatory Visit: Payer: Self-pay | Admitting: Family Medicine

## 2023-04-30 ENCOUNTER — Telehealth: Payer: Self-pay | Admitting: Family Medicine

## 2023-04-30 ENCOUNTER — Encounter: Payer: Self-pay | Admitting: Family Medicine

## 2023-04-30 NOTE — Telephone Encounter (Signed)
Pt thinks he had COVID (did not test) has had cough, GI distress, sore throat and runny nose and fatigue which subsided last Thursday. Call Transferred to [facility] to schedule wither virtual or in person with available provider.

## 2023-04-30 NOTE — Telephone Encounter (Signed)
Pt would like a call back about some symptoms. Please advise.

## 2023-05-01 ENCOUNTER — Telehealth: Payer: BC Managed Care – PPO

## 2023-05-01 DIAGNOSIS — J40 Bronchitis, not specified as acute or chronic: Secondary | ICD-10-CM

## 2023-05-01 MED ORDER — ALBUTEROL SULFATE HFA 108 (90 BASE) MCG/ACT IN AERS: 2.0000 | INHALATION_SPRAY | Freq: Four times a day (QID) | RESPIRATORY_TRACT | 0 refills | Status: AC | PRN

## 2023-05-01 MED ORDER — PREDNISONE 20 MG PO TABS: 20.0000 mg | ORAL_TABLET | Freq: Two times a day (BID) | ORAL | 0 refills | Status: AC

## 2023-05-01 MED ORDER — AZITHROMYCIN 250 MG PO TABS
ORAL_TABLET | ORAL | 0 refills | Status: AC
Start: 2023-05-01 — End: 2023-05-06

## 2023-05-01 NOTE — Progress Notes (Signed)
Virtual Visit Consent   Eric Vazquez, you are scheduled for a virtual visit with a [facility] provider today. Just as with appointments in the office, your consent must be obtained to participate. Your consent will be active for this visit and any virtual visit you may have with one of our providers in the next 365 days. If you have a MyChart account, a copy of this consent can be sent to you electronically.  As this is a virtual visit, video technology does not allow for your provider to perform a traditional examination. This may limit your provider's ability to fully assess your condition. If your provider identifies any concerns that need to be evaluated in person or the need to arrange testing (such as labs, EKG, etc.), we will make arrangements to do so. Although advances in technology are sophisticated, we cannot ensure that it will always work on either your end or our end. If the connection with a video visit is poor, the visit may have to be switched to a telephone visit. With either a video or telephone visit, we are not always able to ensure that we have a secure connection.  By engaging in this virtual visit, you consent to the provision of healthcare and authorize for your insurance to be billed (if applicable) for the services provided during this visit. Depending on your insurance coverage, you may receive a charge related to this service.  I need to obtain your verbal consent now. Are you willing to proceed with your visit today? Eric Vazquez has provided verbal consent on 05/01/2023 for a virtual visit (video or telephone). Eric Simas, FNP  Date: 05/01/2023 7:38 AM  Virtual Visit via Video Note   I, Eric Vazquez, connected with  Eric Vazquez  ([id number], 10-09-1961) on 05/01/23 at  7:45 AM EDT by a video-enabled telemedicine application and verified that I am speaking with the correct person using two identifiers.  Location: Patient: Virtual Visit  Location Patient: Home Provider: Virtual Visit Location Provider: Home Office   I discussed the limitations of evaluation and management by telemedicine and the availability of in person appointments. The patient expressed understanding and agreed to proceed.    History of Present Illness: Eric Vazquez is a 62 y.o. who identifies as a male who was assigned male at birth, and is being seen today for cough and loss of voice.   He has been suffering for the past 9 days   Symptom onset with head congestion eventually started to drain to chest with a slightly productive cough   Coughing and sneezing nasal congestion and now loss of voice today   History of reactive airway from viral illnesses requiring inhaler use in the past   Denies a fever  Denies a change in the color of mucous   He has been using OTC cough medications for relief   Problems:  Patient Active Problem List   Diagnosis Date Noted   Left shoulder pain 06/10/2021   Erectile dysfunction due to arterial insufficiency 03/23/2020   Peyronie's disease 03/23/2020   Hx of adenomatous polyp of colon 09/25/2019   Rhinitis, allergic 08/19/2019   Laryngopharyngeal reflux (LPR) 12/14/2017   Nasal septal deviation 12/14/2017   Post-nasal drainage 12/14/2017   Left hip flexor tightness 03/17/2016   Nonallopathic lesion of lumbosacral region 03/17/2016   Nonallopathic lesion of thoracic region 03/17/2016   Nonallopathic lesion of sacral region 03/17/2016   Vitamin D deficiency 12/10/2015   GERD (gastroesophageal reflux disease)  Depression, controlled 09/10/2015   Thrombocytopenia (HCC) 03/30/2015   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   Pernicious anemia 03/26/2015   Actinic keratosis 03/26/2015   Hypothyroidism 12/20/2010    Allergies:  Allergies  Allergen Reactions   Latex Other (See Comments)    Other   Penicillins    Medications:  Current Outpatient Medications:    cholecalciferol (VITAMIN D3) 25 MCG (1000 UT)  tablet, Take 2,000 Units by mouth 3 (three) times a week., Disp: , Rfl:    cyanocobalamin (,VITAMIN B-12,) 1000 MCG/ML injection, INJECT 1 ML INTO THE MUSCLE EVERY 14 DAYS, Disp: 25 mL, Rfl: 3   folic acid (FOLVITE) 1 MG tablet, TAKE 1 TABLET(1 MG) BY MOUTH DAILY, Disp: 90 tablet, Rfl: 3   SYNTHROID 175 MCG tablet, TAKE 1 TABLET BY MOUTH EVERY DAY BEFORE BREAKFAST, Disp: 90 tablet, Rfl: 3   Syringe/Needle, Disp, (SYRINGE 3CC/25GX1") 25G X 1" 3 ML MISC, INJECT 1 ML INTO THE MUSCLE EVERY 14 DAYS, Disp: 50 each, Rfl: 0   Syringe/Needle, Disp, (SYRINGE 3CC/25GX1-1/2") 25G X 1-1/2" 3 ML MISC, Use to administer B12 injection every 14 days or as directed., Disp: 100 each, Rfl: 1   tadalafil (CIALIS) 5 MG tablet, Take 5 mg by mouth daily as needed for erectile dysfunction., Disp: , Rfl:    TURMERIC PO, Take by mouth., Disp: , Rfl:   Observations/Objective: Patient is well-developed, well-nourished in no acute distress.  Resting comfortably  at home.  Head is normocephalic, atraumatic.  No labored breathing.  Speech is clear and coherent with logical content.  Patient is alert and oriented at baseline.    Assessment and Plan:  1. Bronchitis  - albuterol (VENTOLIN HFA) 108 (90 Base) MCG/ACT inhaler; Inhale 2 puffs into the lungs every 6 (six) hours as needed for wheezing or shortness of breath.  Dispense: 8 g; Refill: 0 - predniSONE (DELTASONE) 20 MG tablet; Take 1 tablet (20 mg total) by mouth 2 (two) times daily with a meal for 5 days.  Dispense: 10 tablet; Refill: 0 - azithromycin (ZITHROMAX) 250 MG tablet; Take 2 tablets on day 1, then 1 tablet daily on days 2 through 5  Dispense: 6 tablet; Refill: 0     Follow Up Instructions: I discussed the assessment and treatment plan with the patient. The patient was provided an opportunity to ask questions and all were answered. The patient agreed with the plan and demonstrated an understanding of the instructions.  A copy of instructions were sent to  the patient via MyChart unless otherwise noted below.    The patient was advised to call back or seek an in-person evaluation if the symptoms worsen or if the condition fails to improve as anticipated.  Time:  I spent 15 minutes with the patient via telehealth technology discussing the above problems/concerns.    Eric Simas, FNP

## 2023-05-18 ENCOUNTER — Ambulatory Visit: Payer: BC Managed Care – PPO | Admitting: Family

## 2023-06-24 ENCOUNTER — Other Ambulatory Visit: Payer: Self-pay | Admitting: Family Medicine

## 2023-07-05 ENCOUNTER — Other Ambulatory Visit: Payer: Self-pay | Admitting: Family Medicine

## 2023-07-13 ENCOUNTER — Encounter: Payer: BC Managed Care – PPO | Admitting: Family Medicine

## 2023-08-12 ENCOUNTER — Other Ambulatory Visit: Payer: Self-pay | Admitting: Family Medicine

## 2023-08-14 ENCOUNTER — Encounter: Payer: Self-pay | Admitting: Family Medicine

## 2023-08-14 ENCOUNTER — Other Ambulatory Visit: Payer: Self-pay

## 2023-08-14 MED ORDER — FOLIC ACID 1 MG PO TABS: 1.0000 mg | ORAL_TABLET | Freq: Every day | ORAL | 3 refills | Status: DC

## 2023-11-16 ENCOUNTER — Encounter: Payer: Self-pay | Admitting: Family

## 2023-11-16 ENCOUNTER — Ambulatory Visit (INDEPENDENT_AMBULATORY_CARE_PROVIDER_SITE_OTHER): Payer: 59 | Admitting: Family

## 2023-11-16 VITALS — BP 125/79 | HR 50 | Temp 97.9°F | Wt 223.0 lb

## 2023-11-16 DIAGNOSIS — R053 Chronic cough: Secondary | ICD-10-CM | POA: Diagnosis not present

## 2023-11-16 NOTE — Progress Notes (Signed)
 Patient ID: Eric Vazquez, male    DOB: 09-Jul-1961, 63 y.o.   MRN: [id number]  Chief Complaint  Patient presents with   Cough    Pt c/o Cough and SOB, Present for years. Pt states he does not us  albuterol  in haler unless he's sick.        Discussed the use of AI scribe software for clinical note transcription with the patient, who gave verbal consent to proceed.  History of Present Illness   Eric Vazquez is a 64 year old male who presents with a chronic cough.  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 The cough is sometimes productive with phlegmy, clear sputum, but there is no colored sputum or signs of infection. He has tried various treatments including Tessalon  Perles, nasal saline, nasal steroids, and antacids without significant improvement. An albuterol  inhaler provides some relief during severe episodes, particularly in the spring and early summer. No heartburn or reflux symptoms are present, and he has tried reflux medications without relief. He denies any history of smoking and reports no ear pain or swallowing difficulties. He has a history of sinus surgeries performed between 1989 and 2000. Despite saline irrigation and ENT evaluations, he continues to experience nasal obstruction on one side. He states they never evaluated his throat. He reports having allergy testing in the past and nothing significant found at that time.    Assessment & Plan:     Chronic Cough - Persistent cough with occasional clear phlegm, no acute respiratory distress. Previous treatments including Tessalon  pearls, nasal saline, antacids and nasal steroids have been ineffective. No diagnosis of asthma, but inhaler used during severe episodes. No history of smoking. Possible causes discussed include asthma, postnasal drip, and reflux, pharyngeal polyps, new allergies. -Refer  to pulmonology for further evaluation and possible imaging. -Consider ENT referral if pulmonary evaluation is unremarkable.     Subjective:    Outpatient Medications Prior to Visit  Medication Sig Dispense Refill   albuterol  (VENTOLIN  HFA) 108 (90 Base) MCG/ACT inhaler Inhale 2 puffs into the lungs every 6 (six) hours as needed for wheezing or shortness of breath. 8 g 0   cholecalciferol (VITAMIN D3) 25 MCG (1000 UT) tablet Take 2,000 Units by mouth daily.     cyanocobalamin  (VITAMIN B12) 1000 MCG/ML injection INJECT 1ML INTO THE MUSCLE EVERY 14 DAYS 25 mL 3   folic acid  (FOLVITE ) 1 MG tablet Take 1 tablet (1 mg total) by mouth daily. 90 tablet 3   SYNTHROID  175 MCG tablet TAKE 1 TABLET BY MOUTH EVERY DAY BEFORE BREAKFAST 90 tablet 3   Syringe/Needle, Disp, (SYRINGE 3CC/25GX1) 25G X 1 3 ML MISC INJECT 1 ML INTO THE MUSCLE EVERY 14 DAYS 50 each 0   Syringe/Needle, Disp, (SYRINGE 3CC/25GX1-1/2) 25G X 1-1/2 3 ML MISC Use to administer B12 injection every 14 days or as directed. 100 each 1   tadalafil (CIALIS) 5 MG tablet Take 5 mg by mouth daily as needed for erectile dysfunction.     TURMERIC PO Take by mouth.     No facility-administered medications prior to visit.   Past Medical History:  Diagnosis Date   Allergy    Arthritis    hands- not treated    Chicken pox    GERD (gastroesophageal reflux disease)    Bertrum disease    Hearing loss    L ear, has  hearing aid. high speed suction over years   Hemorrhoids    Hx of adenomatous polyp of colon 09/25/2019   Hypothyroidism    Pernicious anemia    Thrombocytopenia (HCC)    Ulnar neuropathy at elbow of right upper extremity    Past Surgical History:  Procedure Laterality Date   COLONOSCOPY  12/2008   normal   SEPTOPLASTY  1987; 1993   with turbinate reduction, atroplasty   SHOULDER SURGERY Right 2012   labral tear   TONSILLECTOMY AND ADENOIDECTOMY  1970   tubinectomy     Allergies  Allergen Reactions   Latex Other (See  Comments)    Other   Penicillins       Objective:    Physical Exam Vitals and nursing note reviewed.  Constitutional:      General: He is not in acute distress.    Appearance: Normal appearance.  HENT:     Head: Normocephalic.     Right Ear: Tympanic membrane and ear canal normal.     Left Ear: Tympanic membrane and ear canal normal.     Nose: Congestion present.     Right Sinus: No frontal sinus tenderness.     Left Sinus: No frontal sinus tenderness.     Mouth/Throat:     Mouth: Mucous membranes are moist.     Pharynx: No pharyngeal swelling, oropharyngeal exudate, posterior oropharyngeal erythema or uvula swelling.     Tonsils: 0 on the right. 0 on the left.  Cardiovascular:     Rate and Rhythm: Normal rate and regular rhythm.  Pulmonary:     Effort: Pulmonary effort is normal.     Breath sounds: Normal breath sounds.  Musculoskeletal:        General: Normal range of motion.     Cervical back: Normal range of motion.  Skin:    General: Skin is warm and dry.  Neurological:     Mental Status: He is alert and oriented to person, place, and time.  Psychiatric:        Mood and Affect: Mood normal.    BP 125/79 (Cuff Size: Large)   Pulse (!) 50   Temp 97.9 F (36.6 C) (Temporal)   Wt 223 lb (101.2 kg)   SpO2 97%   BMI 29.42 kg/m  Wt Readings from Last 3 Encounters:  11/16/23 223 lb (101.2 kg)  07/07/22 208 lb 12.8 oz (94.7 kg)  12/06/21 197 lb (89.4 kg)      [name], NP

## 2023-11-23 ENCOUNTER — Encounter: Payer: BC Managed Care – PPO | Admitting: Family Medicine

## 2023-11-30 ENCOUNTER — Ambulatory Visit: Payer: 59 | Admitting: Family

## 2023-11-30 ENCOUNTER — Encounter: Payer: Self-pay | Admitting: Family

## 2023-11-30 VITALS — BP 125/71 | HR 57 | Temp 98.1°F | Ht 73.0 in | Wt 223.0 lb

## 2023-11-30 DIAGNOSIS — Z23 Encounter for immunization: Secondary | ICD-10-CM

## 2023-11-30 DIAGNOSIS — Z13220 Encounter for screening for lipoid disorders: Secondary | ICD-10-CM | POA: Diagnosis not present

## 2023-11-30 DIAGNOSIS — Z0000 Encounter for general adult medical examination without abnormal findings: Secondary | ICD-10-CM

## 2023-11-30 DIAGNOSIS — E559 Vitamin D deficiency, unspecified: Secondary | ICD-10-CM

## 2023-11-30 DIAGNOSIS — D510 Vitamin B12 deficiency anemia due to intrinsic factor deficiency: Secondary | ICD-10-CM | POA: Diagnosis not present

## 2023-11-30 LAB — LIPID PANEL
Cholesterol: 175 mg/dL (ref 0–200)
HDL: 45.3 mg/dL (ref >39.00)
LDL Cholesterol: 112 mg/dL — ABNORMAL HIGH (ref 0–99)
NonHDL: 130.09
Total CHOL/HDL Ratio: 4
Triglycerides: 89 mg/dL (ref 0.0–149.0)
VLDL: 17.8 mg/dL (ref 0.0–40.0)

## 2023-11-30 LAB — COMPREHENSIVE METABOLIC PANEL
ALT: 19 U/L (ref 0–53)
AST: 19 U/L (ref 0–37)
Albumin: 4.6 g/dL (ref 3.5–5.2)
Alkaline Phosphatase: 49 U/L (ref 39–117)
BUN: 14 mg/dL (ref 6–23)
CO2: 28 meq/L (ref 19–32)
Calcium: 9 mg/dL (ref 8.4–10.5)
Chloride: 104 meq/L (ref 96–112)
Creatinine, Ser: 1.04 mg/dL (ref 0.40–1.50)
GFR: 77.35 mL/min (ref >60.00)
Glucose, Bld: 109 mg/dL — ABNORMAL HIGH (ref 70–99)
Potassium: 4.1 meq/L (ref 3.5–5.1)
Sodium: 139 meq/L (ref 135–145)
Total Bilirubin: 1.4 mg/dL — ABNORMAL HIGH (ref 0.2–1.2)
Total Protein: 7.3 g/dL (ref 6.0–8.3)

## 2023-11-30 LAB — CBC WITH DIFFERENTIAL/PLATELET
Basophils Absolute: 0 10*3/uL (ref 0.0–0.1)
Basophils Relative: 0.2 % (ref 0.0–3.0)
Eosinophils Absolute: 0 10*3/uL (ref 0.0–0.7)
Eosinophils Relative: 0.8 % (ref 0.0–5.0)
HCT: 45 % (ref 39.0–52.0)
Hemoglobin: 15.1 g/dL (ref 13.0–17.0)
Lymphocytes Relative: 20.9 % (ref 12.0–46.0)
Lymphs Abs: 1.1 10*3/uL (ref 0.7–4.0)
MCHC: 33.5 g/dL (ref 30.0–36.0)
MCV: 90.6 fL (ref 78.0–100.0)
Monocytes Absolute: 0.6 10*3/uL (ref 0.1–1.0)
Monocytes Relative: 11.7 % (ref 3.0–12.0)
Neutro Abs: 3.6 10*3/uL (ref 1.4–7.7)
Neutrophils Relative %: 66.4 % (ref 43.0–77.0)
Platelets: 137 10*3/uL — ABNORMAL LOW (ref 150.0–400.0)
RBC: 4.97 Mil/uL (ref 4.22–5.81)
RDW: 13 % (ref 11.5–15.5)
WBC: 5.4 10*3/uL (ref 4.0–10.5)

## 2023-11-30 LAB — TSH: TSH: 0.73 u[IU]/mL (ref 0.35–5.50)

## 2023-11-30 LAB — VITAMIN D 25 HYDROXY (VIT D DEFICIENCY, FRACTURES): VITD: 62.22 ng/mL (ref 30.00–100.00)

## 2023-11-30 LAB — VITAMIN B12: Vitamin B-12: 455 pg/mL (ref 211–911)

## 2023-11-30 NOTE — Patient Instructions (Signed)
 It was very nice to see you today!   I will review your lab results via MyChart in a few days.   Have a great weekend!    PLEASE NOTE:  If you had any lab tests please let us know if you have not heard back within a few days. You may see your results on MyChart before we have a chance to review them but we will give you a call once they are reviewed by [place]. If we ordered any referrals today, please let us know if you have not heard from their office within the next week.

## 2023-11-30 NOTE — Progress Notes (Signed)
 Phone: [phone]  Subjective:  Patient 63 y.o. male presenting for annual physical.  Chief Complaint  Patient presents with   Annual Exam    Fasting w/ labs    See problem oriented charting- ROS- full  review of systems was completed and negative.  The following were reviewed and entered/updated in epic: Past Medical History:  Diagnosis Date   Allergy    Arthritis    hands- not treated    Chicken pox    GERD (gastroesophageal reflux disease)    [place] Lone disease    Hearing loss    L ear, has hearing aid. 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   Hx of adenomatous polyp of colon 09/25/2019   Rhinitis, allergic 08/19/2019   Laryngopharyngeal reflux (LPR) 12/14/2017   Nasal septal deviation 12/14/2017   Post-nasal drainage 12/14/2017   Left hip flexor tightness 03/17/2016   Nonallopathic lesion of lumbosacral region 03/17/2016   Nonallopathic lesion of thoracic region 03/17/2016   Nonallopathic lesion of sacral region 03/17/2016   Vitamin D deficiency 12/10/2015   GERD (gastroesophageal reflux disease)    Depression, controlled 09/10/2015   Thrombocytopenia (HCC) 03/30/2015   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   Pernicious anemia 03/26/2015   Actinic keratosis 03/26/2015   Hypothyroidism 12/20/2010   Past Surgical History:  Procedure Laterality Date   COLONOSCOPY  12/2008   normal   SEPTOPLASTY  1987; 1993   with turbinate reduction, atroplasty   SHOULDER SURGERY Right 2012   labral tear   TONSILLECTOMY AND ADENOIDECTOMY  1970   tubinectomy      Family History  Problem Relation Age of Onset   Pernicious anemia  Mother    Pernicious anemia Maternal Grandfather    Alcohol abuse Maternal Grandfather    Alcohol abuse Maternal Grandmother    Diabetes Maternal Grandmother    Colon cancer Paternal Uncle    Colon polyps Neg Hx    Esophageal cancer Neg Hx    Rectal cancer Neg Hx    Stomach cancer Neg Hx     Medications- reviewed and updated Current Outpatient Medications  Medication Sig Dispense Refill   albuterol (VENTOLIN HFA) 108 (90 Base) MCG/ACT inhaler Inhale 2 puffs into the lungs every 6 (six) hours as needed for wheezing or shortness of breath. 8 g 0   cholecalciferol (VITAMIN D3) 25 MCG (1000 UT) tablet Take 2,000 Units by mouth daily.     cyanocobalamin (VITAMIN B12) 1000 MCG/ML injection INJECT INTO THE MUSCLE EVERY 14 DAYS 25 mL 3   folic acid (FOLVITE) 1 MG tablet Take 1 tablet (1 mg total) by mouth daily. 90 tablet 3   SYNTHROID 175 MCG tablet TAKE 1 TABLET BY MOUTH EVERY DAY BEFORE BREAKFAST 90 tablet 3   Syringe/Needle, Disp, (SYRINGE 3CC/25GX1") 25G X 1" 3 ML MISC INJECT 1 ML INTO THE MUSCLE EVERY 14 DAYS 50 each 0   Syringe/Needle, Disp, (SYRINGE 3CC/25GX1-1/2") 25G X 1-1/2" 3 ML MISC Use to administer B12 injection every 14 days or as directed. 100 each 1   tadalafil (CIALIS) 5 MG tablet Take 5 mg by mouth daily as needed  for erectile dysfunction.     TURMERIC PO Take by mouth.     No current facility-administered medications for this visit.    Allergies-reviewed and updated Allergies  Allergen Reactions   Latex Other (See Comments)    Other   Penicillins     Social History   Social History Narrative   Married. Roddie Mc.      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 He lives with his wife & 3 sons (twins Jasmine Pang and Clover at [place], older Troy at [place], 1 son is grown Alycia Rossetti- to be married 2017). He is from [place], lived in [place] before relocating to [place].      Hobbies: enjoys yardwork, hiking, back packing when able   Exercises at home- walking,  working out, biking.     Objective:  BP 125/71 (BP Location: Left Arm, Patient Position: Sitting, Cuff Size: Large)   Pulse (!) 57   Temp 98.1 F (36.7 C) (Temporal)   Ht 6\' 1"  (1.854 m)   Wt 223 lb (101.2 kg)   SpO2 97%   BMI 29.42 kg/m  Physical Exam Vitals and nursing note reviewed.  Constitutional:      General: He is not in acute distress.    Appearance: Normal appearance.  HENT:     Head: Normocephalic.     Right Ear: Tympanic membrane and external ear normal.     Left Ear: Tympanic membrane and external ear normal.     Nose: Nose normal.     Mouth/Throat:     Mouth: Mucous membranes are moist.  Eyes:     Extraocular Movements: Extraocular movements intact.  Cardiovascular:     Rate and Rhythm: Normal rate and regular rhythm.  Pulmonary:     Effort: Pulmonary effort is normal.     Breath sounds: Normal breath sounds.  Abdominal:     General: Abdomen is flat. There is no distension.     Palpations: Abdomen is soft.     Tenderness: There is no abdominal tenderness.  Musculoskeletal:        General: Normal range of motion.     Cervical back: Normal range of motion.  Skin:    General: Skin is warm and dry.  Neurological:     Mental Status: He is alert and oriented to person, place, and time.  Psychiatric:        Mood and Affect: Mood normal.        Behavior: Behavior normal.        Judgment: Judgment normal.      Assessment and Plan   Health Maintenance counseling: 1. Anticipatory guidance: Patient counseled regarding regular dental exams q6 months, eye exams yearly, avoiding smoking and second hand smoke, limiting alcohol to 2 beverages per day.   2. Risk factor reduction:  Advised patient of need for regular exercise and diet rich in fruits and vegetables to reduce risk of heart attack and stroke. Exercise- 2-3d/week.   Wt Readings from Last 3 Encounters:  11/30/23 223 lb (101.2 kg)  11/16/23 223 lb (101.2 kg)  07/07/22 208 lb 12.8 oz (94.7 kg)   3.  Immunizations/screenings/ancillary studies Immunization History  Administered Date(s) Administered   Influenza Inj Mdck Quad Pf 06/22/2018   Influenza Split 06/25/2013, 07/02/2014   Influenza,inj,Quad PF,6+ Mos 06/02/2016, 06/15/2019, 07/01/2021, 07/07/2022   Influenza-Unspecified 06/26/2015, 07/09/2017, 06/13/2020   PFIZER(Purple Top)SARS-COV-2 Vaccination 10/17/2019, 11/04/2019, 07/17/2020   PPD Test 03/26/2015   Td 07/03/2003, 07/02/2013   Tdap 03/26/2015  Zoster Recombinant(Shingrix) 03/18/2021, 05/26/2021   Health Maintenance Due  Topic Date Due   INFLUENZA VACCINE  05/10/2023   COVID-19 Vaccine (4 - 2024-25 season) 06/10/2023    4. Prostate cancer screening >55yo - risk factors? no 5. Colon cancer screening: last in 2020; hx of positive colon ca in uncle 6. Skin cancer screening-  advised regular sunscreen use. Denies worrisome, changing, or new skin lesions. Under care of a dermatologist. 7. Smoking associated screening (lung cancer screening, AAA screen 65-75, UA)- never smoker 8. STD screening - N/A 9. Alcohol screening: rare  Annual physical exam -     TSH -     Lipid panel -     CBC with Differential/Platelet -     Comprehensive metabolic panel  Vitamin D deficiency -     VITAMIN D 25 Hydroxy (Vit-D Deficiency, Fractures)  Pernicious anemia -     Vitamin B12  Need for pneumococcal vaccination -     Pneumococcal conjugate vaccine 20-valent   Recommended follow up:  Return for any future concerns, Complete physical w/fasting labs. Future Appointments  Date Time Provider Department Center  12/12/2024  8:20 AM Shelva Majestic, MD [facility]    Lab/Order associations: fasting    Dulce Sellar, NP

## 2023-12-01 ENCOUNTER — Encounter: Payer: Self-pay | Admitting: Family

## 2024-02-05 ENCOUNTER — Other Ambulatory Visit: Payer: Self-pay

## 2024-02-05 ENCOUNTER — Encounter: Payer: Self-pay | Admitting: Family Medicine

## 2024-02-05 MED ORDER — SYNTHROID 175 MCG PO TABS: ORAL_TABLET | ORAL | 3 refills | Status: AC

## 2024-03-12 ENCOUNTER — Telehealth

## 2024-03-12 DIAGNOSIS — L509 Urticaria, unspecified: Secondary | ICD-10-CM | POA: Diagnosis not present

## 2024-03-12 DIAGNOSIS — R12 Heartburn: Secondary | ICD-10-CM | POA: Diagnosis not present

## 2024-03-12 MED ORDER — PANTOPRAZOLE SODIUM 40 MG PO TBEC: 40.0000 mg | DELAYED_RELEASE_TABLET | Freq: Every day | ORAL | 0 refills | Status: AC

## 2024-03-12 MED ORDER — PREDNISONE 10 MG PO TABS: ORAL_TABLET | ORAL | 0 refills | Status: DC

## 2024-03-12 MED ORDER — FAMOTIDINE 20 MG PO TABS
20.0000 mg | ORAL_TABLET | Freq: Two times a day (BID) | ORAL | 0 refills | Status: AC
Start: 2024-03-12 — End: ?

## 2024-03-12 NOTE — Progress Notes (Signed)
 Virtual Visit Consent   Eric Vazquez, you are scheduled for a virtual visit with a [facility] provider today. Just as with appointments in the office, your consent must be obtained to participate. Your consent will be active for this visit and any virtual visit you may have with one of our providers in the next 365 days. If you have a MyChart account, a copy of this consent can be sent to you electronically.  As this is a virtual visit, video technology does not allow for your provider to perform a traditional examination. This may limit your provider's ability to fully assess your condition. If your provider identifies any concerns that need to be evaluated in person or the need to arrange testing (such as labs, EKG, etc.), we will make arrangements to do so. Although advances in technology are sophisticated, we cannot ensure that it will always work on either your end or our end. If the connection with a video visit is poor, the visit may have to be switched to a telephone visit. With either a video or telephone visit, we are not always able to ensure that we have a secure connection.  By engaging in this virtual visit, you consent to the provision of healthcare and authorize for your insurance to be billed (if applicable) for the services provided during this visit. Depending on your insurance coverage, you may receive a charge related to this service.  I need to obtain your verbal consent now. Are you willing to proceed with your visit today? Eric Vazquez has provided verbal consent on 03/12/2024 for a virtual visit (video or telephone). Angelia Kelp, PA-C  Date: 03/12/2024 1:07 PM   Virtual Visit via Video Note   I, Angelia Kelp, connected with  Eric Vazquez  ([id number], [birth year]) on 03/12/24 at  7:45 AM EDT by a video-enabled telemedicine application and verified that I am speaking with the correct person using two  identifiers.  Location: Patient: Virtual Visit Location Patient: Home Provider: Virtual Visit Location Provider: Home Office   I discussed the limitations of evaluation and management by telemedicine and the availability of in person appointments. The patient expressed understanding and agreed to proceed.    History of Present Illness: Eric Vazquez is a 63 y.o. who identifies as a male who was assigned male at birth, and is being seen today for Urticaria and Heartburn.  HPI: Urticaria This is a new problem. The current episode started 1 to 4 weeks ago (2 weeks). The problem has been gradually worsening since onset. The affected locations include the torso, back, right arm, left arm, left upper leg and right upper leg. The rash is characterized by redness and itchiness. It is unknown if there was an exposure to a precipitant. Associated symptoms include coughing (chronic). Pertinent negatives include no congestion, facial edema, fatigue, fever, shortness of breath or sore throat. Past treatments include antihistamine (benadryl). The treatment provided mild relief.  Heartburn He complains of abdominal pain (epigastric), coughing (chronic), globus sensation and heartburn. He reports no chest pain, no choking, no hoarse voice, no sore throat or no water brash. This is a new problem. The current episode started in the past 7 days. The problem occurs frequently. The problem has been waxing and waning. The heartburn duration is an hour. The heartburn is located in the substernum and LUQ. The heartburn is of moderate intensity. The heartburn wakes him from sleep. The heartburn does not limit his activity. The heartburn doesn't change  with position. Exacerbated by: unknown. Pertinent negatives include no fatigue. There are no known risk factors. He has tried head elevation and a diet change for the symptoms. The treatment provided no relief.     Problems:  Patient Active Problem List   Diagnosis  Date Noted   Left shoulder pain 06/10/2021   Erectile dysfunction due to arterial insufficiency 03/23/2020   Peyronie's disease 03/23/2020   Hx of adenomatous polyp of colon 09/25/2019   Rhinitis, allergic 08/19/2019   Laryngopharyngeal reflux (LPR) 12/14/2017   Nasal septal deviation 12/14/2017   Post-nasal drainage 12/14/2017   Left hip flexor tightness 03/17/2016   Nonallopathic lesion of lumbosacral region 03/17/2016   Nonallopathic lesion of thoracic region 03/17/2016   Nonallopathic lesion of sacral region 03/17/2016   Vitamin D  deficiency 12/10/2015   GERD (gastroesophageal reflux disease)    Depression, controlled 09/10/2015   Thrombocytopenia (HCC) 03/30/2015   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   Pernicious anemia 03/26/2015   Actinic keratosis 03/26/2015   Hypothyroidism 12/20/2010    Allergies:  Allergies  Allergen Reactions   Latex Other (See Comments)    Other   Penicillins    Medications:  Current Outpatient Medications:    famotidine (PEPCID) 20 MG tablet, Take 1 tablet (20 mg total) by mouth 2 (two) times daily., Disp: 30 tablet, Rfl: 0   pantoprazole  (PROTONIX ) 40 MG tablet, Take 1 tablet (40 mg total) by mouth daily., Disp: 30 tablet, Rfl: 0   predniSONE  (DELTASONE ) 10 MG tablet, Days 1-4 take 4 tablets (40 mg) daily  Days 5-8 take 3 tablets (30 mg) daily, Days 9-11 take 2 tablets (20 mg) daily, Days 12-14 take 1 tablet (10 mg) daily., Disp: 37 tablet, Rfl: 0   albuterol  (VENTOLIN  HFA) 108 (90 Base) MCG/ACT inhaler, Inhale 2 puffs into the lungs every 6 (six) hours as needed for wheezing or shortness of breath., Disp: 8 g, Rfl: 0   cholecalciferol (VITAMIN D3) 25 MCG (1000 UT) tablet, Take 2,000 Units by mouth daily., Disp: , Rfl:    cyanocobalamin  (VITAMIN B12) 1000 MCG/ML injection, INJECT 1ML INTO THE MUSCLE EVERY 14 DAYS, Disp: 25 mL, Rfl: 3   folic acid  (FOLVITE ) 1 MG tablet, Take 1 tablet (1 mg total) by mouth daily., Disp: 90 tablet, Rfl: 3   SYNTHROID  175 MCG  tablet, TAKE 1 TABLET BY MOUTH EVERY DAY BEFORE BREAKFAST, Disp: 90 tablet, Rfl: 3   Syringe/Needle, Disp, (SYRINGE 3CC/25GX1") 25G X 1" 3 ML MISC, INJECT 1 ML INTO THE MUSCLE EVERY 14 DAYS, Disp: 50 each, Rfl: 0   Syringe/Needle, Disp, (SYRINGE 3CC/25GX1-1/2") 25G X 1-1/2" 3 ML MISC, Use to administer B12 injection every 14 days or as directed., Disp: 100 each, Rfl: 1   tadalafil (CIALIS) 5 MG tablet, Take 5 mg by mouth daily as needed for erectile dysfunction., Disp: , Rfl:    TURMERIC PO, Take by mouth., Disp: , Rfl:   Observations/Objective: Patient is well-developed, well-nourished in no acute distress.  Resting comfortably at home.  Head is normocephalic, atraumatic.  No labored breathing.  Speech is clear and coherent with logical content.  Patient is alert and oriented at baseline.    Assessment and Plan: 1. Urticaria (Primary) - predniSONE  (DELTASONE ) 10 MG tablet; Days 1-4 take 4 tablets (40 mg) daily  Days 5-8 take 3 tablets (30 mg) daily, Days 9-11 take 2 tablets (20 mg) daily, Days 12-14 take 1 tablet (10 mg) daily.  Dispense: 37 tablet; Refill: 0  2. Heartburn - famotidine (PEPCID) 20  MG tablet; Take 1 tablet (20 mg total) by mouth 2 (two) times daily.  Dispense: 30 tablet; Refill: 0 - pantoprazole  (PROTONIX ) 40 MG tablet; Take 1 tablet (40 mg total) by mouth daily.  Dispense: 30 tablet; Refill: 0  - Will add Prednisone  for hives - Added Pepcid as well for hives and heartburn - Protonix  has been sent and can be added to Pepcid for heartburn if needed - Cool compresses to itchy hives - Luke warm to cool showers - Discussed following up in person for further evaluation for trigger with PCP  Follow Up Instructions: I discussed the assessment and treatment plan with the patient. The patient was provided an opportunity to ask questions and all were answered. The patient agreed with the plan and demonstrated an understanding of the instructions.  A copy of instructions were  sent to the patient via MyChart unless otherwise noted below.    The patient was advised to call back or seek an in-person evaluation if the symptoms worsen or if the condition fails to improve as anticipated.    Angelia Kelp, PA-C

## 2024-03-12 NOTE — Patient Instructions (Signed)
 Eric Vazquez, thank you for joining Eric Kelp, PA-C for today's virtual visit.  While this provider is not your primary care provider (PCP), if your PCP is located in our provider database this encounter information will be shared with them immediately following your visit.   A [place] MyChart account gives you access to today's visit and all your visits, tests, and labs performed at [facility] " click here if you don't have a [facility] MyChart account or go to [email or web address]  Consent: (Patient) Eric Vazquez Low provided verbal consent for this virtual visit at the beginning of the encounter.  Current Medications:  Current Outpatient Medications:    famotidine (PEPCID) 20 MG tablet, Take 1 tablet (20 mg total) by mouth 2 (two) times daily., Disp: 30 tablet, Rfl: 0   pantoprazole  (PROTONIX ) 40 MG tablet, Take 1 tablet (40 mg total) by mouth daily., Disp: 30 tablet, Rfl: 0   predniSONE  (DELTASONE ) 10 MG tablet, Days 1-4 take 4 tablets (40 mg) daily  Days 5-8 take 3 tablets (30 mg) daily, Days 9-11 take 2 tablets (20 mg) daily, Days 12-14 take 1 tablet (10 mg) daily., Disp: 37 tablet, Rfl: 0   albuterol  (VENTOLIN  HFA) 108 (90 Base) MCG/ACT inhaler, Inhale 2 puffs into the lungs every 6 (six) hours as needed for wheezing or shortness of breath., Disp: 8 g, Rfl: 0   cholecalciferol (VITAMIN D3) 25 MCG (1000 UT) tablet, Take 2,000 Units by mouth daily., Disp: , Rfl:    cyanocobalamin  (VITAMIN B12) 1000 MCG/ML injection, INJECT 1ML INTO THE MUSCLE EVERY 14 DAYS, Disp: 25 mL, Rfl: 3   folic acid  (FOLVITE ) 1 MG tablet, Take 1 tablet (1 mg total) by mouth daily., Disp: 90 tablet, Rfl: 3   SYNTHROID  175 MCG tablet, TAKE 1 TABLET BY MOUTH EVERY DAY BEFORE BREAKFAST, Disp: 90 tablet, Rfl: 3   Syringe/Needle, Disp, (SYRINGE 3CC/25GX1") 25G X 1" 3 ML MISC, INJECT 1 ML INTO THE MUSCLE EVERY 14 DAYS, Disp: 50 each, Rfl: 0   Syringe/Needle, Disp, (SYRINGE  3CC/25GX1-1/2") 25G X 1-1/2" 3 ML MISC, Use to administer B12 injection every 14 days or as directed., Disp: 100 each, Rfl: 1   tadalafil (CIALIS) 5 MG tablet, Take 5 mg by mouth daily as needed for erectile dysfunction., Disp: , Rfl:    TURMERIC PO, Take by mouth., Disp: , Rfl:    Medications ordered in this encounter:  Meds ordered this encounter  Medications   predniSONE  (DELTASONE ) 10 MG tablet    Sig: Days 1-4 take 4 tablets (40 mg) daily  Days 5-8 take 3 tablets (30 mg) daily, Days 9-11 take 2 tablets (20 mg) daily, Days 12-14 take 1 tablet (10 mg) daily.    Dispense:  37 tablet    Refill:  0    Supervising Provider:   Corine Dice [[id number]]   famotidine (PEPCID) 20 MG tablet    Sig: Take 1 tablet (20 mg total) by mouth 2 (two) times daily.    Dispense:  30 tablet    Refill:  0    Supervising Provider:   LAMPTEY, PHILIP O [[id number]]   pantoprazole  (PROTONIX ) 40 MG tablet    Sig: Take 1 tablet (40 mg total) by mouth daily.    Dispense:  30 tablet    Refill:  0    Supervising Provider:   Corine Dice [phone]     *If you need refills on other medications prior to your next appointment, please contact your  pharmacy*  Follow-Up: Call back or seek an in-person evaluation if the symptoms worsen or if the condition fails to improve as anticipated.  [facility] [phone]  Other Instructions  GERD in Adults: Diet Changes When you have gastroesophageal reflux disease (GERD), you may need to make changes to your diet. Choosing the right foods can help with your symptoms. Think about working with an expert in healthy eating called a dietitian. They can help you make healthy food choices. What are tips for following this plan? Reading food labels Look for foods that are low in saturated fat. Foods that may help with your symptoms include: Foods with less than 5% of daily value (DV) of fat. Foods with 0 grams of trans fat. Cooking [employer] in ways  that don't use a lot of fat. These ways include: Baking. Steaming. Grilling. Broiling. To add flavor, try to use herbs that are low in spice and acidity. Avoid frying your food. Meal planning  Eat small meals often rather than eating 3 large meals each day. Eat your meals slowly in a place where you feel relaxed. If told by your health care provider, avoid: Foods that cause symptoms. Keep a food diary to keep track of foods that cause symptoms. Alcohol. Drinking a lot of liquid with meals. General instructions For 2-3 hours after you eat, avoid: Bending over. Exercise. Lying down. Chew sugar-free gum after meals. What foods should I eat? Eat a healthy diet. Try to include: Foods with high amounts of fiber. These include: Fruits and vegetables. Whole grains and beans. Low-fat dairy products. Lean meats, fish, and poultry. Egg whites. Foods that cause symptoms in someone else may not cause symptoms for you. Work with your provider to find foods that are safe for you. The items listed above may not be all the foods and drinks you can have. Talk with a dietitian to learn more. The items listed above may not be a complete list of foods and beverages you can eat and drink. Contact a dietitian for more information. What foods should I avoid? Limiting some of these foods may help with your symptoms. Each person is different. Talk with a dietitian or your provider to help you find the exact foods to avoid. Some of the foods to avoid may include: Fruits Fruits with a lot of acid in them. These may include citrus fruits, such as oranges, grapefruit, pineapple, and lemons. Vegetables Deep-fried vegetables, such as [place] fries. Vegetables, sauces, or toppings made with added fat and vegetables with acid in them. These may include tomatoes and tomato products, chili peppers, onions, garlic, and horseradish. Grains Pastries or quick breads with added fat. Meats and other proteins High-fat  meats, such as fatty beef or pork, hot dogs, ribs, ham, sausage, salami, and bacon. Fried meat or protein, such as fried fish and fried chicken. Egg yolks. Fats and oils Butter. Margarine. Shortening. Ghee. Drinks Coffee and other drinks with caffeine in them. Fizzy and sugary drinks, such as soda and energy drinks. Fruit juice made with acidic fruits, such as orange or grapefruit. Tomato juice. Sweets and desserts Chocolate and cocoa. Donuts. Seasonings and condiments Mint, such as peppermint and spearmint. Condiments, herbs, or seasonings that cause symptoms. These may include curry, hot sauce, or vinegar-based salad dressings. The items listed above may not be all the foods and drinks you should avoid. Talk with a dietitian to learn more. Questions to ask your health care provider Changes to your diet and  everyday life are often the first steps taken to manage symptoms of GERD. If these changes don't help, talk with your provider about taking medicines. Where to find more information International [employer] for G[employer]intestinal Disorders: aboutgerd.org This information is not intended to replace advice given to you by your health care provider. Make sure you discuss any questions you have with your health care provider. Document Revised: 08/07/2023 Document Reviewed: 02/21/2023 Elsevier Patient Education  2024 Elsevier Inc.   Hives Hives (urticaria) are itchy, red, swollen areas of skin. They can show up on any part of the body. They often fade within 24 hours (acute hives). If you get new hives after the old ones fade and the cycle goes on for many days or weeks, it is called chronic hives. Hives do not spread from person to person (are not contagious). Hives can happen when your body reacts to something you are allergic to (allergen) or to something that irritates your skin. When you are exposed to something that triggers hives, your body releases a chemical called histamine. This causes  redness, itching, and swelling. Hives can show up right after you are exposed to a trigger or hours later. What are the causes? Hives may be caused by: Food allergies. Insect bites or stings. Allergies to pollen or pets. Spending time in sunlight, heat, or cold (exposure). Exercise. Stress. You can also get hives from other conditions and treatments. These include: Viruses, such as the common cold. Bacterial infections, such as urinary tract infections and strep throat. Certain medicines. Contact with latex or chemicals. Allergy shots. Blood transfusions. In some cases, the cause of hives is not known (idiopathic hives). What increases the risk? You are more likely to get hives if: You are male. You have food allergies. Hives are more common if you are allergic to citrus fruits, milk, eggs, peanuts, tree nuts, or shellfish. You are allergic to: Medicines. Latex. Insects. Animals. Pollen. What are the signs or symptoms? Common symptoms of hives include raised, itchy, red or white bumps or patches on your skin. These areas may: Become large and swollen (welts). Quickly change shape and location. This may happen more than once. Be separate hives or connect over a large area of skin. Sting or become painful. Turn white when pressed in the center (blanch). In severe cases, your hands, feet, and face may also become swollen. This may happen if hives form deeper in your skin. How is this diagnosed? Hives may be diagnosed based on your symptoms, medical history, and a physical exam. You may have skin, pee (urine), or blood tests done. These can help find out what is causing your hives and rule out other health issues. You may also have a biopsy done. This is when a small piece of skin is removed for testing. How is this treated? Treatment for hives depends on the cause and on how severe your symptoms are. You may be told to use cool, wet cloths (cool compresses) or to take cool  showers to relieve itching. Treatment may also include: Medicines to help: Relieve itching (antihistamines). Reduce swelling (corticosteroids). Treat infection (antibiotics). An injectable medicine called omalizumab. You may need this if you have chronic idiopathic hives and still have symptoms even after you are treated with antihistamines. In severe cases, you may need to use a device filled with medicine that gives an emergency shot of epinephrine (auto-injector pen) to prevent a very bad allergic reaction (anaphylactic reaction). Follow these instructions at home: Medicines Take and apply over-the-counter  and prescription medicines only as told by your health care provider. If you were prescribed antibiotics, take them as told by your provider. Do not stop using the antibiotic even if you start to feel better. Skin care Apply cool compresses to the affected areas. Do not scratch or rub your skin. General instructions Do not take hot showers or baths. This can make itching worse. Do not wear tight-fitting clothing. Use sunscreen. Wear protective clothing when you are outside. Avoid anything that causes your hives. Keep a journal to help track what causes your hives. Write down: What medicines you take. What you eat and drink. What products you use on your skin. Keep all follow-up visits. Your provider will track how well treatment is working. Contact a health care provider if: Your symptoms do not get better with medicine. Your joints are painful or swollen. You have a fever. You have pain in your abdomen. Get help right away if: Your tongue, lips, or eyelids swell. Your chest or throat feels tight. You have trouble breathing or swallowing. These symptoms may be an emergency. Use the auto-injector pen right away. Then call 911. Do not wait to see if the symptoms will go away. Do not drive yourself to the hospital. This information is not intended to replace advice given to you  by your health care provider. Make sure you discuss any questions you have with your health care provider. Document Revised: 06/22/2022 Document Reviewed: 06/13/2022 Elsevier Patient Education  2024 Elsevier Inc.   If you have been instructed to have an in-person evaluation today at a local Urgent Care facility, please use the link below. It will take you to a list of all of our available [facility], including address, phone number and hours of operation. Please do not delay care.  [facility]  If you or a family member do not have a primary care provider, use the link below to schedule a visit and establish care. When you choose a [facility] primary care physician or advanced practice provider, you gain a long-term partner in health. Find a Primary Care Provider  Learn more about [facility]'s in-office and virtual care options: [facility] Get Care Now

## 2024-03-24 ENCOUNTER — Encounter: Payer: Self-pay | Admitting: Family Medicine

## 2024-03-24 ENCOUNTER — Ambulatory Visit: Admitting: Family Medicine

## 2024-03-24 VITALS — BP 120/68 | HR 65 | Temp 97.9°F | Ht 73.0 in | Wt 222.2 lb

## 2024-03-24 DIAGNOSIS — E785 Hyperlipidemia, unspecified: Secondary | ICD-10-CM

## 2024-03-24 DIAGNOSIS — K219 Gastro-esophageal reflux disease without esophagitis: Secondary | ICD-10-CM

## 2024-03-24 DIAGNOSIS — L508 Other urticaria: Secondary | ICD-10-CM

## 2024-03-24 DIAGNOSIS — R1013 Epigastric pain: Secondary | ICD-10-CM | POA: Diagnosis not present

## 2024-03-24 MED ORDER — FAMOTIDINE 20 MG PO TABS: 20.0000 mg | ORAL_TABLET | Freq: Two times a day (BID) | ORAL | 3 refills | Status: AC

## 2024-03-24 NOTE — Patient Instructions (Addendum)
 If hives last more than 6 weeks lets update labs and could reconsider allergist referral -continue claritin until hives gone at least 2 weeks especially with stopping prednisone  soon - I would actually go back on the Pepcid  twice a day as that can benefit reflux plus the hives and is lower risk than pantoprazole  for long term use -if new or worsening symptoms let me know   EKG today reassuring  We will call you within two weeks about your referral for CT calcium scoring through [facility].  Their phone number is [phone].  Please call them if you have not heard in 1-2 weeks  Recommended follow up: Return for next already scheduled visit or sooner if needed.

## 2024-03-24 NOTE — Progress Notes (Signed)
 Phone [phone] In person visit   Subjective:   Eric Vazquez is a 63 y.o. year old very pleasant male patient who presents for/with See problem oriented charting Chief Complaint  Patient presents with   Urticaria    Pt c/o urticaria that Is not resolving and has been going on since 05/19.    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    Priority: Medium    Hx of adenomatous polyp of colon 09/25/2019    Priority: Medium    Vitamin D  deficiency 12/10/2015    Priority: Medium    Depression, controlled 09/10/2015    Priority: Medium    Pernicious anemia 03/26/2015    Priority: Medium    Hypothyroidism 12/20/2010    Priority: Medium    Erectile dysfunction due to arterial insufficiency 03/23/2020    Priority: Low   Rhinitis, allergic 08/19/2019    Priority: Low   Laryngopharyngeal reflux (LPR) 12/14/2017    Priority: Low   Post-nasal drainage 12/14/2017    Priority: Low   Left hip flexor tightness 03/17/2016    Priority: Low   Nonallopathic lesion of lumbosacral region 03/17/2016    Priority: Low   Nonallopathic lesion of thoracic region 03/17/2016    Priority: Low   Nonallopathic lesion of sacral region 03/17/2016    Priority: Low   GERD (gastroesophageal reflux disease)     Priority: Low   Thrombocytopenia (HCC) 03/30/2015    Priority: Low   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   Nasal septal deviation 12/14/2017    Medications- reviewed and updated Current Outpatient Medications  Medication Sig Dispense Refill   cholecalciferol (VITAMIN D3) 25 MCG (1000 UT) tablet Take 2,000 Units by mouth daily.     cyanocobalamin  (VITAMIN B12) 1000 MCG/ML injection INJECT 1ML INTO THE MUSCLE EVERY 14 DAYS 25 mL 3   folic acid  (FOLVITE ) 1 MG tablet Take 1 tablet (1 mg total) by mouth daily. 90  tablet 3   predniSONE  (DELTASONE ) 10 MG tablet Days 1-4 take 4 tablets (40 mg) daily  Days 5-8 take 3 tablets (30 mg) daily, Days 9-11 take 2 tablets (20 mg) daily, Days 12-14 take 1 tablet (10 mg) daily. 37 tablet 0   SYNTHROID  175 MCG tablet TAKE 1 TABLET BY MOUTH EVERY DAY BEFORE BREAKFAST 90 tablet 3   Syringe/Needle, Disp, (SYRINGE 3CC/25GX1) 25G X 1 3 ML MISC INJECT 1 ML INTO THE MUSCLE EVERY 14 DAYS 50 each 0   Syringe/Needle, Disp, (SYRINGE 3CC/25GX1-1/2) 25G X 1-1/2 3 ML MISC Use to administer B12 injection every 14 days or as directed. 100 each 1   tadalafil (CIALIS) 5 MG tablet Take 5 mg by mouth daily as needed for erectile dysfunction.     TURMERIC PO Take by mouth.     albuterol  (VENTOLIN  HFA) 108 (90 Base) MCG/ACT inhaler Inhale 2 puffs into the lungs every 6 (six) hours as needed for wheezing or shortness of breath. 8 g 0   famotidine  (PEPCID ) 20 MG tablet Take 1 tablet (20 mg total) by mouth 2 (two) times daily. 180 tablet 3   pantoprazole  (PROTONIX ) 40 MG tablet Take 1 tablet (40 mg total) by mouth daily. (Patient not taking: Reported on 03/24/2024) 30 tablet 0   No current facility-administered medications for this visit.  Objective:  BP 120/68   Pulse 65   Temp 97.9 F (36.6 C)   Ht 6' 1 (1.854 m)   Wt 222 lb 3.2 oz (100.8 kg)   SpO2 97%   BMI 29.32 kg/m  Gen: NAD, resting comfortably CV: RRR no murmurs rubs or gallops Lungs: CTAB no crackles, wheeze, rhonchi Abdomen: soft/nontender/nondistended/normal bowel sounds. No rebound or guarding.  Ext: no edema Skin: warm, dry Neuro: grossly normal, moves all extremities    EKG: Sinus rhythm with rate 62, normal axis, normal intervals, normal hypertrophy, no st or t wave changes     Assessment and Plan    #Rash/urticaria # GERD S: Patient reports recurrent hives since 02/25/2024. He was returning from [place] and noted reflux as first symptom- thought maybe food related at first. Several episodes since then.  Other than travel he's unaware of any new exposures and he tried to be careful with food intake.   He was seen by video visit on 03/12/2024 and reported hives over the torso, back, bilateral arms, bilateral legs.  Reported pruritic nature.  He was unsure of a precipitant.  Also noted some coughing but was rather chronic.  Denied congestion, facial edema, fatigue, fever, shortness of breath, sore throat. - Had reported trial of Benadryl with mild relief  He also reported epigastric abdominal pain, chronic cough, globus sensation with concern for heartburn for the past 7 days moderate intensity noted  At time of urgent care visit he was started on a prednisone  taper over 2 weeks.  For reflux was started on pantoprazole  40 mg once daily along with Pepcid  which could also help hives and heartburn.  Advised cool compresses and follow-up with PCP -symptom(s) improved and started to reduce antihistamines plus was tapering prednisone  and down 10 mg and noted worsening in last 24 hours - still doing claritin in the day and benadryl at night - has tapered the Pepcid  to once a day now - pantoprazole  only if needed  As far as the reflux- usually at night or early morning. Trying to be careful with foods and going more bland. Rare alcohol and not corresponding to that. Maybe 4-5 times had episodes. Some increased stress with work. No shortness of breath, no left arm or neck pain. 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 No new medications . Not immunocompromised. No mucus membrane involvement. no lip or tongue swelling or shortness of breath.  A/P: Patient's symptoms started with what appears to be acid reflux/GERD and then hives started.  The symptoms can certainly overlap.  There could be an alternate trigger of course and we discussed allergen testing but he wanted to opt out for now.  If symptoms persist beyond 6 weeks would  do labs for chronic care urticaria-he prefers to hold off for now. - Recommended Claritin to be continued consistently - Had dropped Pepcid  down to once a day and encouraged him to restart as flaring symptoms seemed to go along with dropping the Pepcid  as well as prednisone  dose but would prefer to get him off prednisone  long-term. -New or worsening symptoms he should let us  know immediately or seek care  #hyperlipidemia S: Medication: None -With epigastric pain and his age he wanted to be careful from a cardiac perspective Lab Results  Component Value Date   CHOL 175 11/30/2023   HDL 45.30 11/30/2023   LDLCALC 112 (H) 11/30/2023   TRIG 89.0 11/30/2023  CHOLHDL 4 11/30/2023   A/P: With epigastric pain we completed an EKG which was very reassuring.  His ASCVD risk is increased to 8.7% so recommended CT calcium score to further risk stratify plus this could give us  some reassurance in regards to his prior epigastric pain in relation of possible underlying CAD though I strongly suspect this is GERD related-the fact he has no exertional pain is very reassuring and he has been exercising regularly without pain   Recommended follow up: Return for next already scheduled visit or sooner if needed. Future Appointments  Date Time Provider Department Center  12/19/2024  8:20 AM Almira Jaeger, MD [facility]    Lab/Order associations:   ICD-10-CM   1. Acute urticaria  L50.8     2. Gastroesophageal reflux disease, unspecified whether esophagitis present  K21.9 famotidine  (PEPCID ) 20 MG tablet    3. Epigastric abdominal pain  R10.13 EKG 12-Lead    4. Mild hyperlipidemia  E78.5 CT CARDIAC SCORING ([facility] LOCATIONS ONLY)      Meds ordered this encounter  Medications   famotidine  (PEPCID ) 20 MG tablet    Sig: Take 1 tablet (20 mg total) by mouth 2 (two) times daily.    Dispense:  180 tablet    Refill:  3    Return precautions advised.  Clarisa Crooked, MD

## 2024-03-27 ENCOUNTER — Other Ambulatory Visit: Payer: Self-pay

## 2024-03-27 ENCOUNTER — Encounter: Payer: Self-pay | Admitting: Family Medicine

## 2024-03-27 DIAGNOSIS — L508 Other urticaria: Secondary | ICD-10-CM

## 2024-03-27 DIAGNOSIS — L509 Urticaria, unspecified: Secondary | ICD-10-CM

## 2024-03-27 MED ORDER — PREDNISONE 20 MG PO TABS: ORAL_TABLET | ORAL | 0 refills | Status: DC

## 2024-04-04 ENCOUNTER — Ambulatory Visit
Admission: RE | Admit: 2024-04-04 | Discharge: 2024-04-04 | Disposition: A | Discharge status: Home / Self Care | Source: Ambulatory Visit | Attending: Family Medicine | Admitting: Family Medicine

## 2024-04-04 DIAGNOSIS — E785 Hyperlipidemia, unspecified: Secondary | ICD-10-CM

## 2024-04-08 ENCOUNTER — Ambulatory Visit: Payer: Self-pay | Admitting: Family Medicine

## 2024-04-08 DIAGNOSIS — L509 Urticaria, unspecified: Secondary | ICD-10-CM

## 2024-04-10 ENCOUNTER — Ambulatory Visit: Payer: Self-pay | Admitting: Family Medicine

## 2024-04-10 ENCOUNTER — Other Ambulatory Visit (INDEPENDENT_AMBULATORY_CARE_PROVIDER_SITE_OTHER): Payer: Self-pay

## 2024-04-10 DIAGNOSIS — L509 Urticaria, unspecified: Secondary | ICD-10-CM | POA: Diagnosis not present

## 2024-04-10 LAB — CBC WITH DIFFERENTIAL/PLATELET
Basophils Absolute: 0 K/uL (ref 0.0–0.1)
Basophils Relative: 0.1 % (ref 0.0–3.0)
Eosinophils Absolute: 0.1 K/uL (ref 0.0–0.7)
Eosinophils Relative: 2.1 % (ref 0.0–5.0)
HCT: 44.2 % (ref 39.0–52.0)
Hemoglobin: 15.1 g/dL (ref 13.0–17.0)
Lymphocytes Relative: 31.2 % (ref 12.0–46.0)
Lymphs Abs: 1.4 K/uL (ref 0.7–4.0)
MCHC: 34.2 g/dL (ref 30.0–36.0)
MCV: 87.8 fl (ref 78.0–100.0)
Monocytes Absolute: 0.6 K/uL (ref 0.1–1.0)
Monocytes Relative: 13 % — ABNORMAL HIGH (ref 3.0–12.0)
Neutro Abs: 2.5 K/uL (ref 1.4–7.7)
Neutrophils Relative %: 53.6 % (ref 43.0–77.0)
Platelets: 139 K/uL — ABNORMAL LOW (ref 150.0–400.0)
RBC: 5.03 Mil/uL (ref 4.22–5.81)
RDW: 13 % (ref 11.5–15.5)
WBC: 4.6 K/uL (ref 4.0–10.5)

## 2024-04-10 LAB — COMPREHENSIVE METABOLIC PANEL WITH GFR
ALT: 18 U/L (ref 0–53)
AST: 15 U/L (ref 0–37)
Albumin: 4.4 g/dL (ref 3.5–5.2)
Alkaline Phosphatase: 60 U/L (ref 39–117)
BUN: 14 mg/dL (ref 6–23)
CO2: 29 meq/L (ref 19–32)
Calcium: 9.3 mg/dL (ref 8.4–10.5)
Chloride: 105 meq/L (ref 96–112)
Creatinine, Ser: 1.11 mg/dL (ref 0.40–1.50)
GFR: 71.36 mL/min
Glucose, Bld: 109 mg/dL — ABNORMAL HIGH (ref 70–99)
Potassium: 4.4 meq/L (ref 3.5–5.1)
Sodium: 141 meq/L (ref 135–145)
Total Bilirubin: 1 mg/dL (ref 0.2–1.2)
Total Protein: 6.7 g/dL (ref 6.0–8.3)

## 2024-04-10 LAB — C-REACTIVE PROTEIN: CRP: <1 mg/dL (ref 0.5–20.0)

## 2024-04-10 LAB — SEDIMENTATION RATE: Sed Rate: 6 mm/h (ref 0–20)

## 2024-04-10 LAB — TSH: TSH: 3 u[IU]/mL (ref 0.35–5.50)

## 2024-04-16 ENCOUNTER — Other Ambulatory Visit: Payer: Self-pay

## 2024-04-17 ENCOUNTER — Encounter: Payer: Self-pay | Admitting: Internal Medicine

## 2024-04-17 ENCOUNTER — Other Ambulatory Visit: Payer: Self-pay

## 2024-04-17 ENCOUNTER — Ambulatory Visit: Admitting: Internal Medicine

## 2024-04-17 VITALS — BP 118/76 | HR 81 | Temp 98.2°F | Resp 18 | Ht 73.0 in | Wt 223.8 lb

## 2024-04-17 DIAGNOSIS — K219 Gastro-esophageal reflux disease without esophagitis: Secondary | ICD-10-CM | POA: Diagnosis not present

## 2024-04-17 DIAGNOSIS — J3089 Other allergic rhinitis: Secondary | ICD-10-CM

## 2024-04-17 DIAGNOSIS — L501 Idiopathic urticaria: Secondary | ICD-10-CM | POA: Diagnosis not present

## 2024-04-17 DIAGNOSIS — R053 Chronic cough: Secondary | ICD-10-CM

## 2024-04-17 MED ORDER — METHYLPREDNISOLONE ACETATE 40 MG/ML IJ SUSP
40.0000 mg | Freq: Once | INTRAMUSCULAR | Status: AC
  Administered 2024-04-17: 40 mg via INTRAMUSCULAR

## 2024-04-17 MED ORDER — PREDNISONE 10 MG PO TABS: ORAL_TABLET | ORAL | 0 refills | Status: DC

## 2024-04-17 NOTE — Progress Notes (Signed)
 NEW PATIENT Date of Service/Encounter:  04/17/24 Referring provider: Katrinka Garnette KIDD, MD Primary care provider: Katrinka Garnette KIDD, MD  Subjective:  Eric Vazquez is a 63 y.o. male  presenting today for evaluation of urticaria, chronic cough  History obtained from: chart review and patient.   Discussed the use of AI scribe software for clinical note transcription with the patient, who gave verbal consent to proceed.  History of Present Illness Eric Vazquez is a 63 year old male who presents with chronic hives and heartburn.  Chronic urticaria - Onset Feb 25, 2024, initially around waistband, upper legs, and groin - Lesions have spread to back and arms - Lesions persist for longer periods, not dry or flaky - More noticeable in the mornings and after showering or working out - Hydrocortisone used during the day for symptom management - No significant improvement with environmental modifications (changing soaps, shampoos) or dietary changes (cutting out beef)  Heartburn - Onset concurrent with initial urticaria episode in May 2025 while in [place] - Described as 'burning, real bad' - Occurs approximately once per week - No significant relief with Pepcid  or Benadryl  Nasal obstruction and rhinorrhea - History of multiple ENT surgeries: turbinectomies, septoplasty, antrostomies - Difficulty breathing through left nostril - Runny nose, particularly in cold environments - Associated throat clearing and nasal congestion  Pernicious anemia - History of pernicious anemia diagnosed after prolonged fatigue and numbness in hands and feet - Initial symptoms were misdiagnosed as multiple sclerosis   Past Medical History: Past Medical History:  Diagnosis Date   Allergy    Arthritis    hands- not treated    Chicken pox    GERD (gastroesophageal reflux disease)    Bertrum disease    Hearing loss    L ear, has hearing aid. high speed suction over years    Hemorrhoids    Hx of adenomatous polyp of colon 09/25/2019   Hypothyroidism    Pernicious anemia    Thrombocytopenia (HCC)    Ulnar neuropathy at elbow of right upper extremity    Urticaria    Medication List:  Current Outpatient Medications  Medication Sig Dispense Refill   albuterol  (VENTOLIN  HFA) 108 (90 Base) MCG/ACT inhaler Inhale 2 puffs into the lungs every 6 (six) hours as needed for wheezing or shortness of breath. 8 g 0   cholecalciferol (VITAMIN D3) 25 MCG (1000 UT) tablet Take 2,000 Units by mouth daily.     cyanocobalamin  (VITAMIN B12) 1000 MCG/ML injection INJECT 1ML INTO THE MUSCLE EVERY 14 DAYS 25 mL 3   famotidine  (PEPCID ) 20 MG tablet Take 1 tablet (20 mg total) by mouth 2 (two) times daily. 180 tablet 3   folic acid  (FOLVITE ) 1 MG tablet Take 1 tablet (1 mg total) by mouth daily. 90 tablet 3   predniSONE  (DELTASONE ) 10 MG tablet Prednisone  10mg  : Take 2 tablets twice a day for 3 more days, Then take 2 tablets once a day for 1 day., then take 1 tablet once a day for 1 day. 15 tablet 0   SYNTHROID  175 MCG tablet TAKE 1 TABLET BY MOUTH EVERY DAY BEFORE BREAKFAST 90 tablet 3   Syringe/Needle, Disp, (SYRINGE 3CC/25GX1) 25G X 1 3 ML MISC INJECT 1 ML INTO THE MUSCLE EVERY 14 DAYS 50 each 0   Syringe/Needle, Disp, (SYRINGE 3CC/25GX1-1/2) 25G X 1-1/2 3 ML MISC Use to administer B12 injection every 14 days or as directed. 100 each 1   tadalafil (CIALIS) 5 MG tablet  Take 5 mg by mouth daily as needed for erectile dysfunction.     TURMERIC PO Take by mouth.     pantoprazole  (PROTONIX ) 40 MG tablet Take 1 tablet (40 mg total) by mouth daily. (Patient not taking: Reported on 04/17/2024) 30 tablet 0   No current facility-administered medications for this visit.   Known Allergies:  Allergies  Allergen Reactions   Latex Other (See Comments)    Other   Penicillins    Past Surgical History: Past Surgical History:  Procedure Laterality Date   COLONOSCOPY  12/2008   normal    SEPTOPLASTY  1987; 1993   with turbinate reduction, atroplasty   SHOULDER SURGERY Right 2012   labral tear   SINOSCOPY     TONSILLECTOMY AND ADENOIDECTOMY  1970   tubinectomy     Family History: Family History  Problem Relation Age of Onset   Eczema Mother    Pernicious anemia Mother    Colon cancer Paternal Uncle    Alcohol abuse Maternal Grandmother    Diabetes Maternal Grandmother    Pernicious anemia Maternal Grandfather    Alcohol abuse Maternal Grandfather    Colon polyps Neg Hx    Esophageal cancer Neg Hx    Rectal cancer Neg Hx    Stomach cancer Neg Hx    Social History: Eric Vazquez lives single-family home is 63 years old.  No water damage.  Hardwood family room carpet in the bedroom gas heating and central cooling.  1 dog with access to bedroom.  No roaches in the house and best to be on the floor.  Does not precautions.  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  No tobacco exposure..   ROS:  All other systems negative except as noted per HPI.  Objective:  Blood pressure 118/76, pulse 81, temperature 98.2 F (36.8 C), temperature source Temporal, resp. rate 18, height 6' 1 (1.854 m), weight 223 lb 12.8 oz (101.5 kg), SpO2 99%. Body mass index is 29.53 kg/m. Physical Exam:  General Appearance:  Alert, cooperative, no distress, appears stated age  Head:  Normocephalic, without obvious abnormality, atraumatic  Eyes:  Conjunctiva clear, EOM's intact  Ears EACs normal bilaterally  Nose: Nares normal, normal mucosa, no visible anterior polyps, and septum midline  Throat: Lips, tongue normal; teeth and gums normal, normal posterior oropharynx  Neck: Supple, symmetrical  Lungs:   clear to auscultation bilaterally, Respirations unlabored, no coughing  Heart:  regular rate and rhythm and no murmur, Appears well perfused  Extremities: No edema  Skin: Diffuse erythematous macules and on chest, back, arms, legs   Neurologic: No gross deficits   Diagnostics: Spirometry:  Tracings  reviewed. His effort: Good reproducible efforts. FVC: 4.14 L (pre),  FEV1: 3.30 L, 81% predicted (pre),  FEV1/FVC ratio: 80 (pre), Interpretation: Spirometry consistent with normal pattern.  Please see scanned spirometry results for details.   Labs:  Lab Orders         IgE         Tryptase         Chronic Urticaria (CU) Eval       Assessment and Plan  Assessment and Plan Assessment & Plan Chronic urticaria Chronic urticaria with persistent hives, no clear allergic trigger, possible stress or immune-related. 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 Not life-threatening but affects quality of life. Prednisone  effective but causes rebound. High-dose antihistamines first-line, Xolair if needed. - Administer 40mg  IM Depo Medrol  today  - Start tomorrow: Prednisone  10mg  :  Take 2 tablets twice a day for 3 more days, Then take 2 tablets once a day for 1 day., then take 1 tablet once a day for 1 day.  - Increase Claritin to four times a day (two in the morning, two at night). - Continue Pepcid .20mg  twice daily  - Order labs for mast cell markers, including tryptase. - Provided handout on Xolair. - Schedule follow-up in four weeks.  Gastroesophageal reflux disease (GERD) Intermittent GERD with heartburn, possibly related to mast cell activation. - Continue Pepcid  20mg  twice daily   Nonallergic rhinitis Nonallergic rhinitis exacerbated by cold, likely nonallergic triggers. - Prescribe nasal Atrovent (ipratropium) 1 spray per nostril as needed, up to four times a day.  Follow up: in 4 weeks     This note in its entirety was forwarded to the Provider who requested this consultation.  Other:    Thank you for your kind referral. I appreciate the opportunity to take part in [place] care. Please do not hesitate to contact me with questions.  Sincerely,  Thank you so much for letting me partake in your care today.  Don't hesitate to reach out if you have any  additional concerns!  Hargis Springer, MD  [facility] [place], [place]

## 2024-04-17 NOTE — Patient Instructions (Addendum)
 Chronic urticaria Chronic urticaria with persistent hives, no clear allergic trigger, possible stress or immune-related. 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 Not life-threatening but affects quality of life. Prednisone  effective but causes rebound. High-dose antihistamines first-line, Xolair if needed. - this is defined as hives lasting more than 6 weeks without an identifiable trigger - hives can be from a number of different sources including infections, allergies, vibration, temperature, pressure among many others other possible causes - often an identifiable cause is not determined - some potential triggers include: stress, illness, NSAIDs, aspirin, hormonal changes - you do not have any red flag symptoms to make us  concerned about secondary causes of hives, but we will screen for these for reassurance with: Chronic urticaria panel, tryptase, IgE  - approximately 50% of patients with chronic hives can have some associated swelling of the face/lips/eyelids (this is not a cause for alarm and does not typically progress onto systemic allergic reactions)  PLAN:  - Administer 40mg  IM Depo Medrol  today  - Start tomorrow: Prednisone  10mg  : Take 2 tablets twice a day for 3 more days, Then take 2 tablets once a day for 1 day., then take 1 tablet once a day for 1 day.  - Increase Claritin to four times a day (two in the morning, two at night). - Continue Pepcid .20mg  twice daily  - Order labs for mast cell markers, including tryptase. - Provided handout on Xolair. - Schedule follow-up in four weeks.  Gastroesophageal reflux disease (GERD) Intermittent GERD with heartburn, possibly related to mast cell activation. - Continue Pepcid  20mg  twice daily   Nonallergic rhinitis Nonallergic rhinitis exacerbated by cold, likely nonallergic triggers. - Prescribe nasal Atrovent (ipratropium) 1 spray per nostril as needed, up to four times a day.  Follow up: in 4 weeks

## 2024-04-18 ENCOUNTER — Encounter: Payer: Self-pay | Admitting: Internal Medicine

## 2024-04-18 MED ORDER — IPRATROPIUM BROMIDE 0.03 % NA SOLN: 1.0000 | Freq: Four times a day (QID) | NASAL | 5 refills | Status: DC | PRN

## 2024-04-18 NOTE — Addendum Note (Signed)
 Addended by: ONEITA CHRISTIANS D on: 04/18/2024 09:08 AM   Modules accepted: Orders

## 2024-04-25 LAB — CHRONIC URTICARIA (CU) EVAL
ALT: 27 IU/L (ref 0–44)
Basophils Absolute: 0 x10E3/uL (ref 0.0–0.2)
Basos: 0 %
CRP: 2 mg/L (ref 0–10)
EOS (ABSOLUTE): 0.1 x10E3/uL (ref 0.0–0.4)
Eos: 1 %
Hematocrit: 49.4 % (ref 37.5–51.0)
Hemoglobin: 16.2 g/dL (ref 13.0–17.7)
Immature Grans (Abs): 0 x10E3/uL (ref 0.0–0.1)
Immature Granulocytes: 0 %
Lymphocytes Absolute: 1.3 x10E3/uL (ref 0.7–3.1)
Lymphs: 18 %
MCH: 30.2 pg (ref 26.6–33.0)
MCHC: 32.8 g/dL (ref 31.5–35.7)
MCV: 92 fL (ref 79–97)
Monocytes Absolute: 0.7 x10E3/uL (ref 0.1–0.9)
Monocytes: 9 %
Neutrophils Absolute: 5.1 x10E3/uL (ref 1.4–7.0)
Neutrophils: 72 %
Platelets: 153 x10E3/uL (ref 150–450)
Pooled Donor- BAT CU: 5.86 % (ref 0.00–10.60)
RBC: 5.37 x10E6/uL (ref 4.14–5.80)
RDW: 12.8 % (ref 11.6–15.4)
Sed Rate: 12 mm/h (ref 0–30)
TSH: 2.39 u[IU]/mL (ref 0.450–4.500)
Thyroperoxidase Ab SerPl-aCnc: 34 [IU]/mL (ref 0–34)
WBC: 7.2 x10E3/uL (ref 3.4–10.8)

## 2024-04-25 LAB — TRYPTASE: Tryptase: 2.9 ug/L (ref 2.2–13.2)

## 2024-04-25 LAB — IGE: IgE (Immunoglobulin E), Serum: 5 [IU]/mL — ABNORMAL LOW (ref 6–495)

## 2024-04-28 ENCOUNTER — Encounter: Payer: Self-pay | Admitting: Internal Medicine

## 2024-04-29 NOTE — Telephone Encounter (Signed)
 They next step would be xolair, but he needs to fail high dose antihistamines for 4 weeks.  This was why I wanted to see him back at 4 weeks.  Have him schedule a follow up appointment the second week of August (initial appointment was 04/17/24).

## 2024-05-01 ENCOUNTER — Ambulatory Visit: Admitting: Allergy

## 2024-05-07 ENCOUNTER — Other Ambulatory Visit (HOSPITAL_BASED_OUTPATIENT_CLINIC_OR_DEPARTMENT_OTHER): Payer: Self-pay

## 2024-05-07 ENCOUNTER — Emergency Department (HOSPITAL_BASED_OUTPATIENT_CLINIC_OR_DEPARTMENT_OTHER)
Admission: EM | Admit: 2024-05-07 | Discharge: 2024-05-07 | Disposition: A | Discharge status: Home / Self Care | Attending: Emergency Medicine | Admitting: Emergency Medicine

## 2024-05-07 ENCOUNTER — Encounter: Payer: Self-pay | Admitting: Internal Medicine

## 2024-05-07 ENCOUNTER — Other Ambulatory Visit: Payer: Self-pay

## 2024-05-07 ENCOUNTER — Ambulatory Visit: Admitting: Internal Medicine

## 2024-05-07 ENCOUNTER — Encounter (HOSPITAL_BASED_OUTPATIENT_CLINIC_OR_DEPARTMENT_OTHER): Payer: Self-pay | Admitting: Emergency Medicine

## 2024-05-07 VITALS — BP 122/68

## 2024-05-07 DIAGNOSIS — Z91040 Latex allergy status: Secondary | ICD-10-CM | POA: Insufficient documentation

## 2024-05-07 DIAGNOSIS — T782XXA Anaphylactic shock, unspecified, initial encounter: Secondary | ICD-10-CM | POA: Diagnosis not present

## 2024-05-07 DIAGNOSIS — D510 Vitamin B12 deficiency anemia due to intrinsic factor deficiency: Secondary | ICD-10-CM

## 2024-05-07 DIAGNOSIS — L299 Pruritus, unspecified: Secondary | ICD-10-CM | POA: Diagnosis present

## 2024-05-07 DIAGNOSIS — L501 Idiopathic urticaria: Secondary | ICD-10-CM

## 2024-05-07 DIAGNOSIS — E039 Hypothyroidism, unspecified: Secondary | ICD-10-CM | POA: Diagnosis not present

## 2024-05-07 DIAGNOSIS — L500 Allergic urticaria: Secondary | ICD-10-CM | POA: Insufficient documentation

## 2024-05-07 LAB — BASIC METABOLIC PANEL WITH GFR
Anion gap: 9 (ref 5–15)
BUN: 12 mg/dL (ref 8–23)
CO2: 28 mmol/L (ref 22–32)
Calcium: 9.4 mg/dL (ref 8.9–10.3)
Chloride: 105 mmol/L (ref 98–111)
Creatinine, Ser: 1.15 mg/dL (ref 0.61–1.24)
GFR, Estimated: >60 mL/min (ref >60)
Glucose, Bld: 140 mg/dL — ABNORMAL HIGH (ref 70–99)
Potassium: 3.8 mmol/L (ref 3.5–5.1)
Sodium: 142 mmol/L (ref 135–145)

## 2024-05-07 LAB — CBC WITH DIFFERENTIAL/PLATELET
Abs Immature Granulocytes: 0.03 K/uL (ref 0.00–0.07)
Basophils Absolute: 0 K/uL (ref 0.0–0.1)
Basophils Relative: 0 %
Eosinophils Absolute: 0.1 K/uL (ref 0.0–0.5)
Eosinophils Relative: 1 %
HCT: 44.9 % (ref 39.0–52.0)
Hemoglobin: 15.1 g/dL (ref 13.0–17.0)
Immature Granulocytes: 0 %
Lymphocytes Relative: 14 %
Lymphs Abs: 1.5 K/uL (ref 0.7–4.0)
MCH: 30.1 pg (ref 26.0–34.0)
MCHC: 33.6 g/dL (ref 30.0–36.0)
MCV: 89.6 fL (ref 80.0–100.0)
Monocytes Absolute: 1 K/uL (ref 0.1–1.0)
Monocytes Relative: 10 %
Neutro Abs: 7.7 K/uL (ref 1.7–7.7)
Neutrophils Relative %: 75 %
Platelets: 130 K/uL — ABNORMAL LOW (ref 150–400)
RBC: 5.01 MIL/uL (ref 4.22–5.81)
RDW: 12.7 % (ref 11.5–15.5)
WBC: 10.4 K/uL (ref 4.0–10.5)
nRBC: 0 % (ref 0.0–0.2)

## 2024-05-07 MED ORDER — PREDNISONE 20 MG PO TABS
40.0000 mg | ORAL_TABLET | Freq: Every day | ORAL | 0 refills | Status: AC
  Filled 2024-05-07: qty 8, 4d supply, fill #0

## 2024-05-07 MED ORDER — EPINEPHRINE 0.3 MG/0.3ML IJ SOAJ
0.3000 mg | INTRAMUSCULAR | 0 refills | Status: DC | PRN
  Filled 2024-05-07 (×2): qty 2, 30d supply, fill #0

## 2024-05-07 MED ORDER — EPINEPHRINE 0.3 MG/0.3ML IJ SOAJ
0.3000 mg | Freq: Once | INTRAMUSCULAR | Status: AC
  Administered 2024-05-07: 0.3 mg via INTRAMUSCULAR
  Filled 2024-05-07: qty 0.3

## 2024-05-07 MED ORDER — DIPHENHYDRAMINE HCL 50 MG/ML IJ SOLN
25.0000 mg | Freq: Once | INTRAMUSCULAR | Status: AC
  Administered 2024-05-07: 25 mg via INTRAVENOUS
  Filled 2024-05-07: qty 1

## 2024-05-07 MED ORDER — FAMOTIDINE IN NACL 20-0.9 MG/50ML-% IV SOLN
20.0000 mg | Freq: Once | INTRAVENOUS | Status: AC
  Administered 2024-05-07: 20 mg via INTRAVENOUS
  Filled 2024-05-07: qty 50

## 2024-05-07 MED ORDER — DEXAMETHASONE SODIUM PHOSPHATE 10 MG/ML IJ SOLN
10.0000 mg | Freq: Once | INTRAMUSCULAR | Status: AC
  Administered 2024-05-07: 10 mg via INTRAVENOUS
  Filled 2024-05-07: qty 1

## 2024-05-07 MED ORDER — ALBUTEROL SULFATE (2.5 MG/3ML) 0.083% IN NEBU
5.0000 mg | INHALATION_SOLUTION | Freq: Once | RESPIRATORY_TRACT | Status: AC
  Administered 2024-05-07: 5 mg via RESPIRATORY_TRACT
  Filled 2024-05-07: qty 6

## 2024-05-07 NOTE — ED Triage Notes (Signed)
 Pt hx of urticaria, c/o shortness of breath, lip swelling and angioedema that started this morning. Loratadine taken this am.

## 2024-05-07 NOTE — Discharge Instructions (Addendum)
Follow-up with your allergist as discussed.  Return to the emergency room if you have any worsening symptoms. ?

## 2024-05-07 NOTE — ED Notes (Signed)
 His left-sided upper lip edema has somewhat subsided. He is drinking fluids without problem.

## 2024-05-07 NOTE — Patient Instructions (Signed)
 Chronic urticaria - this is defined as hives lasting more than 6 weeks without an identifiable trigger - hives can be from a number of different sources including infections, allergies, vibration, temperature, pressure among many others other possible causes - often an identifiable cause is not determined - some potential triggers include: stress, illness, NSAIDs, aspirin, hormonal changes - approximately 50% of patients with chronic hives can have some associated swelling of the face/lips/eyelids (this is not a cause for alarm and does not typically progress onto systemic allergic reactions)  PLAN:  - Continue Prednisone  prescribed by ED  - Continue Claritin to four times a day (two in the morning, two at night). - Continue Pepcid .20mg  twice daily  - Will check alpha gal  - avoid red meat and gelatin until labs returns  - Will submit for xolair 300mg  every 4 weeks   - informed consent obtained today   - Tammy our biologic coordinator will reach out to you for approval process   - Epipen  and allergy action plan prescribed   Gastroesophageal reflux disease (GERD) Intermittent GERD with heartburn, possibly related to mast cell activation. - Continue Pepcid  20mg  twice daily   Nonallergic rhinitis Nonallergic rhinitis exacerbated by cold, likely nonallergic triggers. - Continue nasal Atrovent  (ipratropium) 1 spray per nostril as needed, up to four times a day.  Follow up:3 months after initial xolair injection

## 2024-05-07 NOTE — ED Provider Notes (Addendum)
 [facility] Provider Note   CSN: [id number] Arrival date & time: 05/07/24  9289     Patient presents with: Shortness of Breath   Eric Vazquez is a 63 y.o. male.   Patient is a 63 year old male who presents with itching and mouth swelling.  He said he has had some urticaria since May.  He says it waxes and wanes.  He seen an allergist and they have not been able to determine what is the etiology for this.  He is on loratadine chronically for this.  Yesterday started noticing some respiratory symptoms.  He has a little tickle in his throat with a dry cough.  He has had a little bit of shortness of breath and this morning he woke up with mouth swelling.  He has swelling of his upper lip and feels a little bit swollen in the back of his throat.  No difficulty swallowing.  His voice seems hoarse or than normal.  He does not report any new exposures.  No new medications other than what the allergist has prescribed.       Prior to Admission medications   Medication Sig Start Date End Date Taking? Authorizing Provider  EPINEPHrine  0.3 mg/0.3 mL IJ SOAJ injection Inject 0.3 mg into the muscle as needed for anaphylaxis. 05/07/24  Yes Lenor Hollering, MD  predniSONE  (DELTASONE ) 20 MG tablet 2 tabs po daily x 4 days 05/07/24  Yes Lenor Hollering, MD  albuterol  (VENTOLIN  HFA) 108 (90 Base) MCG/ACT inhaler Inhale 2 puffs into the lungs every 6 (six) hours as needed for wheezing or shortness of breath. 05/01/23   Kennyth Domino, FNP  cholecalciferol (VITAMIN D3) 25 MCG (1000 UT) tablet Take 2,000 Units by mouth daily.    [provider]  cyanocobalamin  (VITAMIN B12) 1000 MCG/ML injection INJECT 1ML INTO THE MUSCLE EVERY 14 DAYS 06/25/23   Katrinka Garnette KIDD, MD  famotidine  (PEPCID ) 20 MG tablet Take 1 tablet (20 mg total) by mouth 2 (two) times daily. 03/24/24   Katrinka Garnette KIDD, MD  folic acid  (FOLVITE ) 1 MG tablet Take 1 tablet (1 mg total) by  mouth daily. 08/14/23   Katrinka Garnette KIDD, MD  ipratropium (ATROVENT ) 0.03 % nasal spray Place 1 spray into both nostrils 4 (four) times daily as needed for rhinitis. 04/18/24   Lorin Norris, MD  pantoprazole  (PROTONIX ) 40 MG tablet Take 1 tablet (40 mg total) by mouth daily. Patient not taking: Reported on 04/17/2024 03/12/24   Burnette, Jennifer M, PA-C  SYNTHROID  175 MCG tablet TAKE 1 TABLET BY MOUTH EVERY DAY BEFORE BREAKFAST 02/05/24   Katrinka Garnette KIDD, MD  Syringe/Needle, Disp, (SYRINGE 3CC/25GX1) 25G X 1 3 ML MISC INJECT 1 ML INTO THE MUSCLE EVERY 14 DAYS 12/20/18   Katrinka Garnette KIDD, MD  Syringe/Needle, Disp, (SYRINGE 3CC/25GX1-1/2) 25G X 1-1/2 3 ML MISC Use to administer B12 injection every 14 days or as directed. 10/12/17   Katrinka Garnette KIDD, MD  tadalafil (CIALIS) 5 MG tablet Take 5 mg by mouth daily as needed for erectile dysfunction.    Nieves Cough, MD  TURMERIC PO Take by mouth.    [provider]    Allergies: Latex and Penicillins    Review of Systems  Constitutional:  Negative for chills, diaphoresis, fatigue and fever.  HENT:  Positive for facial swelling and voice change. Negative for congestion, rhinorrhea, sneezing and trouble swallowing.   Eyes: Negative.   Respiratory:  Positive for cough and shortness of  breath. Negative for chest tightness.   Cardiovascular:  Negative for chest pain and leg swelling.  Gastrointestinal:  Negative for abdominal pain, diarrhea, nausea and vomiting.  Genitourinary:  Negative for difficulty urinating and flank pain.  Musculoskeletal:  Negative for arthralgias and back pain.  Skin:  Negative for rash.  Neurological:  Negative for dizziness, weakness and headaches.    Updated Vital Signs BP 125/73   Pulse 75   Resp 15   SpO2 95%   Physical Exam Constitutional:      Appearance: He is well-developed.  HENT:     Head: Normocephalic and atraumatic.     Mouth/Throat:     Comments: Swelling to the upper lip, no visible  swelling to the throat or tongue Eyes:     Pupils: Pupils are equal, round, and reactive to light.  Cardiovascular:     Rate and Rhythm: Normal rate and regular rhythm.     Heart sounds: Normal heart sounds.  Pulmonary:     Effort: Pulmonary effort is normal. No respiratory distress.     Breath sounds: Wheezing (Mild expiratory wheezes, no increased work of breathing) present. No rales.  Chest:     Chest wall: No tenderness.  Abdominal:     General: Bowel sounds are normal.     Palpations: Abdomen is soft.     Tenderness: There is no abdominal tenderness. There is no guarding or rebound.  Musculoskeletal:        General: Normal range of motion.     Cervical back: Normal range of motion and neck supple.  Lymphadenopathy:     Cervical: No cervical adenopathy.  Skin:    General: Skin is warm and dry.     Findings: Rash present.     Comments: Few patchy areas of urticaria  Neurological:     Mental Status: He is alert and oriented to person, place, and time.     (all labs ordered are listed, but only abnormal results are displayed) Labs Reviewed  BASIC METABOLIC PANEL WITH GFR - Abnormal; Notable for the following components:      Result Value   Glucose, Bld 140 (*)    All other components within normal limits  CBC WITH DIFFERENTIAL/PLATELET - Abnormal; Notable for the following components:   Platelets 130 (*)    All other components within normal limits  ROCKY MTN SPOTTED FVR ABS PNL(IGG+IGM)  LYME DISEASE SEROLOGY W/REFLEX    EKG: None  Radiology: No results found.   Procedures   Medications Ordered in the ED  EPINEPHrine  (EPI-PEN) injection 0.3 mg (0.3 mg Intramuscular Given 05/07/24 0745)  albuterol  (PROVENTIL ) (2.5 MG/3ML) 0.083% nebulizer solution 5 mg (5 mg Nebulization Given 05/07/24 0804)  dexamethasone  (DECADRON ) injection 10 mg (10 mg Intravenous Given 05/07/24 0807)  diphenhydrAMINE  (BENADRYL ) injection 25 mg (25 mg Intravenous Given 05/07/24 0806)   famotidine  (PEPCID ) IVPB 20 mg premix (0 mg Intravenous Stopped 05/07/24 0902)                                    Medical Decision Making Amount and/or Complexity of Data Reviewed Labs: ordered.  Risk Prescription drug management.   This patient presents to the ED for concern of lip swelling, this involves an extensive number of treatment options, and is a complaint that carries with it a high risk of complications and morbidity.  I considered the following differential and admission for this acute, potentially  life threatening condition.  The differential diagnosis includes anaphylaxis, angioedema, medication reaction  MDM:    Patient is a 63 year old who presents with itching and some swelling of his upper lip.  He also noted some swelling in the back of his throat and hoarseness with a dry cough.  He had some wheezing on exam.  He was given subcu epi, Benadryl , Pepcid  and steroids.  He was also given an albuterol  nebulizer treatment.  He had improvement in symptoms after this.  Labs reviewed and are nonconcerning.  He was monitored for 4 hours and his symptoms essentially resolved.  No return the symptoms.  He was discharged home in good condition.  Will put him on a 4-day course of steroids.  Will continue his loratadine.  Will have close follow-up with his allergist.  Return precautions were given.  Wife requested that we do testing for tickborne diseases given his rash.  I do not think that his rash is most suggestive of a tickborne disease but we will go ahead and add serology for [facility] spotted fever and Lyme titers.  He will follow these results up on his MyChart account with his PCP or allergist.  CRITICAL CARE Performed by: Andrea Ness Total critical care time: 60 minutes Critical care time was exclusive of separately billable procedures and treating other patients. Critical care was necessary to treat or prevent imminent or life-threatening deterioration. Critical care  was time spent personally by me on the following activities: development of treatment plan with patient and/or surrogate as well as nursing, discussions with consultants, evaluation of patient's response to treatment, examination of patient, obtaining history from patient or surrogate, ordering and performing treatments and interventions, ordering and review of laboratory studies, ordering and review of radiographic studies, pulse oximetry and re-evaluation of patient's condition.   (Labs, imaging, consults)  Labs: I Ordered, and personally interpreted labs.  The pertinent results include: Electrolytes within normal limits, no anemia  Imaging Studies ordered: I ordered imaging studies including none Additional history obtained from wife.  External records from outside source obtained and reviewed including history  Cardiac Monitoring: The patient was maintained on a cardiac monitor.  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 high speed suction over years   Hemorrhoids    Hx of adenomatous polyp of colon 09/25/2019   Hypothyroidism    Pernicious anemia    Thrombocytopenia (HCC)    Ulnar neuropathy at elbow of right upper extremity    Urticaria      Medicines Meds ordered this encounter  Medications   EPINEPHrine  (EPI-PEN) injection 0.3 mg   albuterol  (PROVENTIL ) (2.5 MG/3ML) 0.083% nebulizer solution 5 mg   dexamethasone   (DECADRON ) injection 10 mg   diphenhydrAMINE  (BENADRYL ) injection 25 mg   famotidine  (PEPCID ) IVPB 20 mg premix   EPINEPHrine  0.3 mg/0.3 mL IJ SOAJ injection    Sig: Inject 0.3 mg into the muscle  as needed for anaphylaxis.    Dispense:  1 each    Refill:  0   predniSONE  (DELTASONE ) 20 MG tablet    Sig: 2 tabs po daily x 4 days    Dispense:  8 tablet    Refill:  0    I have reviewed the patients home medicines and have made adjustments as needed  Problem List / ED Course: Problem List Items Addressed This Visit   None Visit Diagnoses       Anaphylaxis, initial encounter    -  Primary                Final diagnoses:  Anaphylaxis, initial encounter    ED Discharge Orders          Ordered    EPINEPHrine  0.3 mg/0.3 mL IJ SOAJ injection  As needed        05/07/24 1124    predniSONE  (DELTASONE ) 20 MG tablet        05/07/24 1124               Lenor Hollering, MD 05/07/24 1130    Lenor Hollering, MD 05/07/24 1131

## 2024-05-07 NOTE — Progress Notes (Signed)
 "  FOLLOW UP Date of Service/Encounter:  05/13/24  Subjective:  Eric Vazquez (DOB: 10/23/1960) is a 63 y.o. male who [facility] in re-evaluation of the following: urticaria - ED follow up  History obtained from: chart review and patient and wife .  For Review, LV was on 04/17/24  with Dr. Lorin seen for intial visit for urticaria . See below for summary of history and diagnostics.   Today presents for follow-up. Discussed the use of AI scribe software for clinical note transcription with the patient, who gave verbal consent to proceed.  History of Present Illness Eric Vazquez is a 63 year old male with chronic spontaneous urticaria and angioedema who presents with acute lip swelling and difficulty breathing. He is accompanied by his wife, Recardo.  Acute angioedema and airway symptoms - Onset of symptoms yesterday with a tickle in the throat, progressing to difficulty talking and breathing through the nasal area and mouth - Significant lip swelling and numbness, with hoarseness and difficulty speaking which started the today with diffuse urticaria - Presented to ED fore treatment: On exam he had lip swelling, hoarse voice and wheezing on exam.  Also mild urticaria.  He was treated with epinephrine , dexamethasone , Benadryl  IV Benadryl  and Pepcid .  Wheezing and lip swelling improved, however urticaria has remained.  Call today for same-day appointment.  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   All medications reviewed by clinical staff and updated in chart. No new pertinent medical or surgical history except as noted in HPI.  ROS: All others negative except as noted per HPI.   Objective:  BP 122/68 (BP Location: Left Arm, Patient Position: Sitting, Cuff Size: Large)  There is no height or weight on file to calculate BMI. Physical Exam: General Appearance:  Alert, cooperative, no distress, appears stated age  Head:  Normocephalic, without obvious abnormality, atraumatic  Eyes:  Conjunctiva clear, EOM's intact  Ears EACs normal bilaterally  Nose: Nares normal, normal mucosa, no visible anterior polyps, and septum midline  Throat: Lips, tongue normal; teeth and gums normal, normal posterior oropharynx  Neck: Supple, symmetrical  Lungs:   clear to auscultation bilaterally, Respirations unlabored, no coughing  Heart:  regular rate and rhythm and no murmur, Appears well perfused  Extremities: No edema  Skin: Diffuse urticaria   Neurologic: No gross deficits   Labs:  Lab Orders         Alpha-Gal Panel        Assessment/Plan   Patient Instructions  Chronic urticaria - this is defined as hives lasting more than 6 weeks without an identifiable trigger - hives can be from a number  of different sources including infections, allergies, vibration, temperature, pressure among many others other possible causes - often an identifiable cause is not determined - some potential triggers include: stress, illness, NSAIDs, aspirin,  hormonal changes - approximately 50% of patients with chronic hives can have some associated swelling of the face/lips/eyelids (this is not a cause for alarm and does not typically progress onto systemic allergic reactions)  PLAN:  - Continue Prednisone  prescribed by ED  - Continue Claritin to four times a day (two in the morning, two at night). - Continue Pepcid .20mg  twice daily  - Will check alpha gal  - avoid red meat and gelatin until labs returns  - Will submit for xolair 300mg  every 4 weeks   - informed consent obtained today   - Tammy our biologic coordinator will reach out to you for approval process   - Epipen  and allergy action plan prescribed   Gastroesophageal reflux disease (GERD) Intermittent GERD with heartburn, possibly related to mast cell activation. - Continue Pepcid  20mg  twice daily   Nonallergic rhinitis Nonallergic rhinitis exacerbated by cold, likely nonallergic triggers. - Continue nasal Atrovent  (ipratropium) 1 spray per nostril as needed, up to four times a day.  Follow up:3 months after initial xolair injection     Other:    Thank you so much for letting me partake in your care today.  Don't hesitate to reach out if you have any additional concerns!  Hargis Springer, MD  [facility] [place], [place]        "

## 2024-05-08 LAB — LYME DISEASE SEROLOGY W/REFLEX: Lyme Total Antibody EIA: NEGATIVE

## 2024-05-09 ENCOUNTER — Other Ambulatory Visit (HOSPITAL_BASED_OUTPATIENT_CLINIC_OR_DEPARTMENT_OTHER): Payer: Self-pay

## 2024-05-09 ENCOUNTER — Encounter: Payer: Self-pay | Admitting: Internal Medicine

## 2024-05-09 ENCOUNTER — Other Ambulatory Visit: Payer: Self-pay

## 2024-05-09 MED ORDER — EPINEPHRINE 0.3 MG/0.3ML IJ SOAJ: 0.3000 mg | INTRAMUSCULAR | 0 refills | Status: AC | PRN

## 2024-05-10 LAB — ALPHA-GAL PANEL
Allergen Lamb IgE: <0.1 kU/L
Beef IgE: <0.1 kU/L
IgE (Immunoglobulin E), Serum: 6 [IU]/mL (ref 6–495)
O215-IgE Alpha-Gal: <0.1 kU/L
Pork IgE: <0.1 kU/L

## 2024-05-11 ENCOUNTER — Ambulatory Visit
Admission: RE | Admit: 2024-05-11 | Discharge: 2024-05-11 | Disposition: A | Discharge status: Home / Self Care | Attending: Family Medicine | Admitting: Family Medicine

## 2024-05-11 ENCOUNTER — Ambulatory Visit (INDEPENDENT_AMBULATORY_CARE_PROVIDER_SITE_OTHER)

## 2024-05-11 VITALS — BP 130/81 | HR 65 | Temp 99.0°F | Resp 18 | Ht 73.0 in | Wt 225.0 lb

## 2024-05-11 DIAGNOSIS — R051 Acute cough: Secondary | ICD-10-CM

## 2024-05-11 MED ORDER — MONTELUKAST SODIUM 10 MG PO TABS: 10.0000 mg | ORAL_TABLET | Freq: Every day | ORAL | 1 refills | Status: DC

## 2024-05-11 MED ORDER — HYDROCOD POLI-CHLORPHE POLI ER 10-8 MG/5ML PO SUER: 5.0000 mL | Freq: Two times a day (BID) | ORAL | 0 refills | Status: AC | PRN

## 2024-05-11 MED ORDER — HYDROXYZINE HCL 25 MG PO TABS: 25.0000 mg | ORAL_TABLET | Freq: Three times a day (TID) | ORAL | 5 refills | Status: AC | PRN

## 2024-05-11 MED ORDER — AZITHROMYCIN 250 MG PO TABS: ORAL_TABLET | ORAL | 0 refills | Status: DC

## 2024-05-11 NOTE — ED Provider Notes (Signed)
 [facility]    CSN: [id number] Arrival date & time: 05/11/24  0901      History   Chief Complaint Chief Complaint  Patient presents with   Cough    Entered by patient    HPI Eric Vazquez is a 63 y.o. male.   HPI  Patient is here for cough.  He has a chronic cough and is under the care of an allergy specialist.  He recently had pulmonary function testing performed.  He also has laryngeal reflux disease.  He is here because he has an acute cough worse than his usual chronic cough for the last week.  He is producing some tan and gray-colored sputum.  He has terrible coughing spells where he will cough for an hour straight.  His chest hurts from the coughing.  He is tired at times feels short of breath, but does not have asthma and had a normal spirometry earlier this month.  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   Hx of adenomatous polyp of colon 09/25/2019   Rhinitis, allergic 08/19/2019   Laryngopharyngeal reflux (LPR) 12/14/2017   Nasal septal deviation 12/14/2017   Post-nasal drainage 12/14/2017   Left hip flexor tightness 03/17/2016   Nonallopathic lesion of lumbosacral region 03/17/2016   Nonallopathic lesion of thoracic region 03/17/2016   Nonallopathic lesion of sacral region 03/17/2016   Vitamin D  deficiency 12/10/2015   GERD (gastroesophageal reflux disease)     Depression, controlled 09/10/2015   Thrombocytopenia (HCC) 03/30/2015   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 Authorizing Provider  albuterol  (VENTOLIN  HFA) 108 (90 Base) MCG/ACT inhaler Inhale 2 puffs into the lungs every 6 (six) hours as needed for wheezing or shortness of breath. 05/01/23  Yes Kennyth Domino, FNP  azithromycin  (ZITHROMAX  Z-PAK) 250 MG tablet Take two pills today followed by one a day until gone 05/11/24  Yes Maranda Jamee Jacob, MD  chlorpheniramine-HYDROcodone (TUSSIONEX) 10-8 MG/5ML Take 5 mLs by mouth every 12 (twelve) hours as needed for cough. 05/11/24  Yes Maranda Jamee Jacob, MD  cholecalciferol (VITAMIN D3) 25 MCG (1000 UT) tablet Take 2,000 Units by mouth daily.   Yes [provider]  cyanocobalamin  (VITAMIN B12) 1000 MCG/ML injection INJECT 1ML INTO THE MUSCLE EVERY 14 DAYS  06/25/23  Yes Katrinka Garnette KIDD, MD  EPINEPHrine  0.3 mg/0.3 mL IJ SOAJ injection Inject 0.3 mg into the muscle as needed for anaphylaxis. 05/09/24  Yes Lorin Norris, MD  famotidine  (PEPCID ) 20 MG tablet Take 1 tablet (20 mg total) by mouth 2 (two) times daily. 03/24/24  Yes Katrinka Garnette KIDD, MD  folic acid  (FOLVITE ) 1 MG tablet Take 1 tablet (1 mg total) by mouth daily. 08/14/23  Yes Katrinka Garnette KIDD, MD  ipratropium (ATROVENT ) 0.03 % nasal spray Place 1 spray into both nostrils 4 (four) times daily as needed for rhinitis. 04/18/24  Yes Lorin Norris, MD  pantoprazole  (PROTONIX ) 40 MG tablet Take 1 tablet (40 mg total) by mouth daily.  03/12/24  Yes Vivienne Delon HERO, PA-C  predniSONE  (DELTASONE ) 20 MG tablet Take 2 tablets (40 mg total) by mouth daily for 4 days. 05/07/24 05/11/24 Yes Lenor Hollering, MD  SYNTHROID  175 MCG tablet TAKE 1 TABLET BY MOUTH EVERY DAY BEFORE BREAKFAST 02/05/24  Yes Katrinka Garnette KIDD, MD  Syringe/Needle, Disp, (SYRINGE 3CC/25GX1) 25G X 1 3 ML MISC INJECT 1 ML INTO THE MUSCLE EVERY 14 DAYS 12/20/18  Yes Katrinka Garnette KIDD, MD  Syringe/Needle, Disp, (SYRINGE 3CC/25GX1-1/2) 25G X 1-1/2 3 ML MISC Use to administer B12 injection every 14 days or as directed. 10/12/17  Yes Katrinka Garnette KIDD, MD  tadalafil (CIALIS) 5 MG tablet Take 5 mg by mouth daily as needed for erectile dysfunction.   Yes Nieves Cough, MD  TURMERIC PO Take by mouth.   Yes [provider]    Family History Family History  Problem Relation Age of Onset   Eczema Mother    Pernicious anemia Mother    Colon cancer Paternal Uncle    Alcohol abuse Maternal Grandmother    Diabetes Maternal Grandmother    Pernicious anemia Maternal Grandfather    Alcohol abuse Maternal Grandfather    Colon polyps Neg Hx    Esophageal cancer Neg Hx    Rectal cancer Neg Hx    Stomach cancer Neg Hx     Social History Social History   Tobacco Use   Smoking status: Never   Smokeless tobacco: Never  Vaping Use   Vaping status: Never Used  Substance Use Topics   Alcohol use: Yes    Comment: 1 weekly   Drug use: No     Allergies   Latex and Penicillins   Review of Systems Review of Systems  See HPI Physical Exam Triage Vital Signs ED Triage Vitals  Encounter Vitals Group     BP 05/11/24 0910 130/81     Girls Systolic BP Percentile --      Girls Diastolic BP Percentile --      Boys Systolic BP Percentile --      Boys Diastolic BP Percentile --      Pulse Rate 05/11/24 0910 65     Resp 05/11/24 0910 18     Temp 05/11/24 0910 99 F (37.2 C)     Temp Source 05/11/24 0910 Oral     SpO2 05/11/24 0910 99 %     Weight  05/11/24 0912 225 lb (102.1 kg)     Height 05/11/24 0912 6' 1 (1.854 m)     Head Circumference --      Peak Flow --      Pain Score 05/11/24 0912 2     Pain Loc --      Pain Education --      Exclude from Growth Chart --  No data found.  Updated Vital Signs BP 130/81 (BP Location: Right Arm)   Pulse 65   Temp 99 F (37.2 C) (Oral)   Resp 18   Ht 6' 1 (1.854 m)   Wt 102.1 kg   SpO2 99%   BMI 29.69 kg/m        Physical Exam Vitals reviewed.  Constitutional:      General: He is not in acute distress.    Appearance: He is well-developed.  HENT:     Head: Normocephalic and atraumatic.     Right Ear: Tympanic membrane normal.     Left Ear: Tympanic membrane normal.     Nose: Nose normal.     Mouth/Throat:     Mouth: Mucous membranes are moist.     Pharynx: No posterior oropharyngeal erythema.  Eyes:     Conjunctiva/sclera: Conjunctivae normal.     Pupils: Pupils are equal, round, and reactive to light.  Cardiovascular:     Rate and Rhythm: Normal rate and regular rhythm.     Heart sounds: Normal heart sounds.  Pulmonary:     Effort: Pulmonary effort is normal. No respiratory distress.     Comments: Few anterior rhonchi Musculoskeletal:        General: Normal range of motion.     Cervical back: Normal range of motion.  Lymphadenopathy:     Cervical: No cervical adenopathy.  Skin:    General: Skin is warm and dry.  Neurological:     Mental Status: He is alert.      UC Treatments / Results  Labs (all labs ordered are listed, but only abnormal results are displayed) Labs Reviewed - No data to display  EKG   Radiology DG Chest 2 View Result Date: 05/11/2024 EXAM: 2 VIEW(S) XRAY OF THE CHEST 05/11/2024 09:44:20 AM COMPARISON: None available. CLINICAL HISTORY: Cough. Pt states he has had a cough for over a week unable to sleep. FINDINGS: LUNGS AND PLEURA: No focal pulmonary opacity. No pulmonary edema. No pleural effusion. No pneumothorax. HEART AND  MEDIASTINUM: No acute abnormality of the cardiac and mediastinal silhouettes. BONES AND SOFT TISSUES: No acute osseous abnormality. IMPRESSION: 1. No acute process. Electronically signed by: evalene coho 05/11/2024 09:48 AM EDT RP Workstation: [id number]    Procedures Procedures (including critical care time)  Medications Ordered in UC Medications - No data to display  Initial Impression / Assessment and Plan / UC Course  I have reviewed the triage vital signs and the nursing notes.  Pertinent labs & imaging results that were available during my care of the patient were reviewed by me and considered in my medical decision making (see chart for details).     Discussed that most acute cough is caused by a virus.  Discussed cough management.  Fluids.  I gave patient a Z-Pak to take in case he fails to improve with conservative treatment.  Wife is a Publishing rights manager and will help manage care Final Clinical Impressions(s) / UC Diagnoses   Final diagnoses:  Acute cough     Discharge Instructions      Drink lots of fluids Take the Z-Pak as directed Use Tussionex as needed for severe coughing.  This will be helpful at night.  During the day it can cause drowsiness    ED Prescriptions     Medication Sig Dispense Auth. Provider   azithromycin  (ZITHROMAX  Z-PAK) 250 MG tablet Take two pills today followed by one a day until gone 6 tablet Maranda Jamee Jacob,  MD   chlorpheniramine-HYDROcodone (TUSSIONEX) 10-8 MG/5ML Take 5 mLs by mouth every 12 (twelve) hours as needed for cough. 115 mL Maranda Jamee Jacob, MD      I have reviewed the PDMP during this encounter.   Maranda Jamee Jacob, MD 05/11/24 [phone]

## 2024-05-11 NOTE — Discharge Instructions (Signed)
 Drink lots of fluids Take the Z-Pak as directed Use Tussionex as needed for severe coughing.  This will be helpful at night.  During the day it can cause drowsiness

## 2024-05-11 NOTE — ED Triage Notes (Signed)
 Patient c/o productive cough x 1 week, fatigue, HA, unable to rest at night.  Afebrile.  Patient has taken Claritin , Robitussin DM, last dose of prednisone  today.

## 2024-05-12 NOTE — Telephone Encounter (Signed)
 Med have been sent in

## 2024-05-13 ENCOUNTER — Ambulatory Visit: Payer: Self-pay | Admitting: Internal Medicine

## 2024-05-13 NOTE — Progress Notes (Signed)
 Alpha gal panal was negative.  Can someone let patient know?

## 2024-05-14 ENCOUNTER — Encounter: Payer: Self-pay | Admitting: Internal Medicine

## 2024-05-15 ENCOUNTER — Encounter: Payer: Self-pay | Admitting: Internal Medicine

## 2024-05-15 ENCOUNTER — Telehealth: Payer: Self-pay

## 2024-05-15 NOTE — Telephone Encounter (Signed)
 Patient sent MyChart message in reference to prescription for singular and hydroxyzine . Contacted [facility] in [place] and confirmed prescriptions fill and patient is aware.

## 2024-05-20 ENCOUNTER — Telehealth: Payer: Self-pay | Admitting: *Deleted

## 2024-05-20 NOTE — Telephone Encounter (Signed)
 Telephone call out to patient for next steps

## 2024-05-20 NOTE — Telephone Encounter (Signed)
 L/m for patient to contact me to advise approval, copay card and submit to [employer]

## 2024-05-20 NOTE — Telephone Encounter (Signed)
-----   Message from Hargis Springer sent at 05/13/2024  2:53 PM EDT ----- Id like to start this patient on xolair 300mg  every 4 weeks for very poorly controlled urticaria

## 2024-05-21 ENCOUNTER — Ambulatory Visit: Admitting: Internal Medicine

## 2024-05-23 NOTE — Telephone Encounter (Signed)
 Spoke to patient and he has decided to wait on starting Xolair at this time he advised with meds he has been hive free. Advised patient if he decides to start can reach back out to me

## 2024-06-06 ENCOUNTER — Encounter: Payer: 59 | Admitting: Family Medicine

## 2024-07-18 ENCOUNTER — Encounter: Payer: Self-pay | Admitting: Internal Medicine

## 2024-07-18 ENCOUNTER — Telehealth: Payer: Self-pay | Admitting: Internal Medicine

## 2024-07-18 ENCOUNTER — Ambulatory Visit: Admitting: Internal Medicine

## 2024-07-18 VITALS — BP 122/70 | HR 56 | Temp 97.7°F | Ht 73.0 in | Wt 213.0 lb

## 2024-07-18 DIAGNOSIS — Z872 Personal history of diseases of the skin and subcutaneous tissue: Secondary | ICD-10-CM | POA: Diagnosis not present

## 2024-07-18 DIAGNOSIS — R918 Other nonspecific abnormal finding of lung field: Secondary | ICD-10-CM | POA: Diagnosis not present

## 2024-07-18 DIAGNOSIS — R053 Chronic cough: Secondary | ICD-10-CM | POA: Diagnosis not present

## 2024-07-18 LAB — NITRIC OXIDE: Nitric Oxide: 14

## 2024-07-18 MED ORDER — GABAPENTIN 300 MG PO CAPS: ORAL_CAPSULE | ORAL | 1 refills | Status: AC

## 2024-07-18 NOTE — Progress Notes (Signed)
 OV 07/18/2024  Subjective:  Patient ID: Eric Vazquez, male , DOB: [birth year] , age 63 y.o. , MRN: [id number] , ADDRESS: [id number] [facility] [id number] PCP Katrinka Garnette KIDD, MD Patient Care Team: Katrinka Garnette KIDD, MD as PCP - General (Family Medicine) Kristie Lamprey, MD as Consulting Physician (Gastroenterology) Nieves Cough, MD as Consulting Physician (Urology)  This Provider for this visit: Treatment Team:  Attending Provider: Geronimo Amel, MD    07/18/2024 -   Chief Complaint  Patient presents with   Consult    Pt states he has a Prod cough throughout the day and night ( clear phlegm) also feel like a lot of phlegm in chest,  cough been around since July 2025 SOB only occurs when pt is coughing a lot      HPI Eric Vazquez 63 y.o. -is a Education officer, community in the community.  Here for evaluation of acute worsening of chronic cough.  He tells me that in his work environment there is always dust and also there is coldness to the room because the staff keep it at 69 Fahrenheit.  And he will have to work with the heater.  On top of that he has had septoplasty 35 years ago.  He has chronic sinus drainage.  He also has a history of septoplasty antrostomy and difficulty breathing through the left nostril and for many years and even up to a decade he has had clearing of the throat with some amount of mild chronic cough.  He also has history of pernicious anemia  Based on his own history and record review as of February 2025 he was doing well at family nurse practitioner visit.  Then abruptly on March 12, 2024 [started in May 2025] for acute visit for urticaria there was diffuse associated with heartburn and some epigastric pain.  But by July 2025 was established with [facility].  IgE was normal.  ANA was normal.  Lyme antibody normal.  Pork IgE and lamb allergy and alpha gal normal.  Xolair is considered but has not been started.   Then on May 07, 2024 he ended up in the emergency room with urticaria and angioedema.  He was not on ACE inhibitors.  He followed up May 13, 2024 with allergy clinic and was started on Singulair  and hydroxyzine  as of May 15, 2024.  It appears since then he has not had rash issues.  Review of records indicate that he has not had seasonal allergyTesting.  But eosinophils are normal.  He tells me through all this sometime in end of July and going all the way through August and into September he started having worsening of cough.  He does not recall a specific respiratory viral infection but he believes he might of had 1.  The cough just got worse.  Was present at week initially he had a cough significantly even to bring out some amount of mucus but nothing would come out.  The cough would also get worse lying down he could not talk.  He has had 2-3 prednisone  courses.  It was extremely severe in July.  He had to miss work.  But as time went on it got better.  Currently there is just residual cough is 90% better but still early in the morning he has cough.  The cough is made worse after food after water increase by cold environment in the office.  He is constantly trying to clear the throat.  He feels he suppresses the cough at work and when he comes home he lets it go and he coughs quite a bit.    He is clearing the throat LPR cough score is 22 and suggest irritable larynx syndrome.  Exam nitric oxide testing is normal today.  He is on Protonix  and H2 blockade He is on Singulair  He is also on nasal inhaler Atrovent . Denies any fish oil Denies any ACE inhibitor.  Dr Felice Reflux Symptom Index (> 13-15 suggestive of LPR cough) 0 -> 5  =  none ->severe problem 07/18/2024 Feno - 14 ppb  Hoarseness of problem with voice 2  Clearing  Of Throat 3  Excess throat mucus or feeling of post nasal drip 3  Difficulty swallowing food, liquid or tablets 1  Cough after eating or lying down 5  Breathing  difficulties or choking episodes 1  Troublesome or annoying cough 4  Sensation of something sticking in throat or lump in throat 1  Heartburn, chest pain, indigestion, or stomach acid coming up 2  TOTAL 22      FENO 14 ppb and normal  Lab Results  Component Value Date   NITRICOXIDE 14 07/18/2024   This result suggests low (<25) Type 2 (T2) airway inflammation indicating a low likelihood of active T2-driven airway inflammation; reduced probability of response to inhaled corticosteroids.    CT Chest data from date: June 2025  - personally visualized and independently interpreted : yes - my findings are: as below   IMPRESSION: 1. Coronary calcium score is 0. 2. Multiple small pulmonary nodules, largest measuring 4 mm. Many of these nodules are along the fissures and could represent small perifissural lymph nodes. No follow-up needed if patient is low-risk (and has no known or suspected primary neoplasm). Non-contrast chest CT can be considered in 12 months if patient is high-risk. This recommendation follows the consensus statement: Guidelines for Management of Incidental Pulmonary Nodules Detected on CT Images: From the Fleischner Society 2017; Radiology 2017; 284:228-243.     Electronically Signed   By: Juliene Balder M.D.   On: 04/05/2024 20:19 PFT      No data to display            Latest Reference Range & Units 03/26/15 09:03 11/03/16 12:03 07/01/21 14:24 07/07/22 09:05 11/30/23 08:33 04/10/24 08:40 05/07/24 08:13  Eosinophils Absolute 0.0 - 0.5 K/uL 0.1 0.1 0.2 0.1 0.0 0.1 0.1    LAB RESULTS last 96 hours No results found.       has a past medical history of Allergy, Arthritis, Chicken pox, GERD (gastroesophageal reflux disease), Bertrum disease, Hearing loss, Hemorrhoids, adenomatous polyp of colon (09/25/2019), Hypothyroidism, Pernicious anemia, Thrombocytopenia, Ulnar neuropathy at elbow of right upper extremity, and Urticaria.   reports that he has  never smoked. He has never used smokeless tobacco.  Past Surgical History:  Procedure Laterality Date   COLONOSCOPY  12/2008   normal   SEPTOPLASTY  1987; 1993   with turbinate reduction, atroplasty   SHOULDER SURGERY Right 2012   labral tear   SINOSCOPY     TONSILLECTOMY AND ADENOIDECTOMY  1970   tubinectomy      Allergies  Allergen Reactions   Latex Other (See Comments)    Other   Penicillins     Immunization History  Administered Date(s) Administered   Influenza Inj Mdck Quad Pf 06/22/2018   Influenza Split 06/25/2013, 07/02/2014   Influenza,inj,Quad PF,6+ Mos 06/02/2016, 06/15/2019, 07/01/2021, 07/07/2022   Influenza-Unspecified 06/26/2015, 07/09/2017, 06/13/2020, 07/06/2024  PFIZER(Purple Top)SARS-COV-2 Vaccination 10/17/2019, 11/04/2019, 07/17/2020   PNEUMOCOCCAL CONJUGATE-20 11/30/2023   PPD Test 03/26/2015   Td 07/03/2003, 07/02/2013   Tdap 03/26/2015   Zoster Recombinant(Shingrix) 03/18/2021, 05/26/2021    Family History  Problem Relation Age of Onset   Eczema Mother    Pernicious anemia Mother    Colon cancer Paternal Uncle    Alcohol abuse Maternal Grandmother    Diabetes Maternal Grandmother    Pernicious anemia Maternal Grandfather    Alcohol abuse Maternal Grandfather    Colon polyps Neg Hx    Esophageal cancer Neg Hx    Rectal cancer Neg Hx    Stomach cancer Neg Hx      Current Outpatient Medications:    albuterol  (VENTOLIN  HFA) 108 (90 Base) MCG/ACT inhaler, Inhale 2 puffs into the lungs every 6 (six) hours as needed for wheezing or shortness of breath., Disp: 8 g, Rfl: 0   cholecalciferol (VITAMIN D3) 25 MCG (1000 UT) tablet, Take 2,000 Units by mouth daily., Disp: , Rfl:    cyanocobalamin  (VITAMIN B12) 1000 MCG/ML injection, INJECT 1ML INTO THE MUSCLE EVERY 14 DAYS, Disp: 25 mL, Rfl: 3   folic acid  (FOLVITE ) 1 MG tablet, Take 1 tablet (1 mg total) by mouth daily., Disp: 90 tablet, Rfl: 3   gabapentin (NEURONTIN) 300 MG capsule, 1 daily x 7  days, 1 twice daily x 7 days, then 1 three x daily thereafter, Disp: 90 capsule, Rfl: 1   ipratropium (ATROVENT ) 0.03 % nasal spray, Place 1 spray into both nostrils 4 (four) times daily as needed for rhinitis., Disp: 30 mL, Rfl: 5   Nutritional Supplements (LIPOTROPIC COMPLEX PO), Take by mouth., Disp: , Rfl:    SYNTHROID  175 MCG tablet, TAKE 1 TABLET BY MOUTH EVERY DAY BEFORE BREAKFAST, Disp: 90 tablet, Rfl: 3   Syringe/Needle, Disp, (SYRINGE 3CC/25GX1) 25G X 1 3 ML MISC, INJECT 1 ML INTO THE MUSCLE EVERY 14 DAYS, Disp: 50 each, Rfl: 0   Syringe/Needle, Disp, (SYRINGE 3CC/25GX1-1/2) 25G X 1-1/2 3 ML MISC, Use to administer B12 injection every 14 days or as directed., Disp: 100 each, Rfl: 1   tadalafil (CIALIS) 5 MG tablet, Take 5 mg by mouth daily as needed for erectile dysfunction., Disp: , Rfl:    TURMERIC PO, Take by mouth., Disp: , Rfl:    azithromycin  (ZITHROMAX  Z-PAK) 250 MG tablet, Take two pills today followed by one a day until gone (Patient not taking: Reported on 07/18/2024), Disp: 6 tablet, Rfl: 0   chlorpheniramine-HYDROcodone (TUSSIONEX) 10-8 MG/5ML, Take 5 mLs by mouth every 12 (twelve) hours as needed for cough. (Patient not taking: Reported on 07/18/2024), Disp: 115 mL, Rfl: 0   EPINEPHrine  0.3 mg/0.3 mL IJ SOAJ injection, Inject 0.3 mg into the muscle as needed for anaphylaxis. (Patient not taking: Reported on 07/18/2024), Disp: 2 each, Rfl: 0   famotidine  (PEPCID ) 20 MG tablet, Take 1 tablet (20 mg total) by mouth 2 (two) times daily. (Patient not taking: Reported on 07/18/2024), Disp: 180 tablet, Rfl: 3   hydrOXYzine  (ATARAX ) 25 MG tablet, Take 1 tablet (25 mg total) by mouth 3 (three) times daily as needed. (Patient not taking: Reported on 07/18/2024), Disp: 30 tablet, Rfl: 5   montelukast  (SINGULAIR ) 10 MG tablet, Take 1 tablet (10 mg total) by mouth at bedtime. (Patient not taking: Reported on 07/18/2024), Disp: 90 tablet, Rfl: 1   pantoprazole  (PROTONIX ) 40 MG tablet, Take  1 tablet (40 mg total) by mouth daily. (Patient not taking: Reported on 07/18/2024),  Disp: 30 tablet, Rfl: 0      Objective:   Vitals:   07/18/24 0859  BP: 122/70  Pulse: (!) 56  Temp: 97.7 F (36.5 C)  TempSrc: Oral  SpO2: 98%  Weight: 213 lb (96.6 kg)  Height: 6' 1 (1.854 m)    Estimated body mass index is 28.1 kg/m as calculated from the following:   Height as of this encounter: 6' 1 (1.854 m).   Weight as of this encounter: 213 lb (96.6 kg).  @WEIGHTCHANGE @  [employer]   07/18/24 0859  Weight: 213 lb (96.6 kg)     Physical Exam   General: No distress. Looks well. Mild clearing of throat O2 at rest: no Cane present: no Sitting in wheel chair: no Frail: n Obese: on Neuro: Alert and Oriented x 3. GCS 15. Speech normal Psych: Pleasant Resp:  Barrel Chest - on.  Wheeze - o, Crackles - no, No overt respiratory distress CVS: Normal heart sounds. Murmurs - no Ext: Stigmata of Connective Tissue Disease - no HEENT: Normal upper airway. PEERL +. No post nasal drip        Assessment/     Assessment & Plan Chronic cough  Multiple pulmonary nodules  History of allergic urticaria    PLAN Patient Instructions     ICD-10-CM   1. Chronic cough  R05.3 Nitric oxide    2. 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 Sinus issues and possible acid reflux issues could be contributing   Plan - #Sinus drainage  -Current new ongoing treatment    #Possible Acid Reflux -Continue ongoing treatment with Pepcid  and Protonix  - Consider stopping turmeric for 1 month  #Possible Asthma  -  - Continue ongoing treatment with Singulair   - Do pulmonary function test in 8 weeks  - Do allergy blood test in 2-4 weeks  #Cyclical cough otherwise call cough neuropathy  - please choose 2-3 days and observe complete voice rest - no talking or whispering   - at all times there  there is urge to cough, drink water or swallow or sip on throat lozenge - Refer to voice rehabilitation with [facility] - Take gabapentin 300mg  once daily x 7 days, then 300mg  twice daily x 7 days, then 300mg  three times daily to continue. If this makes you too sleepy or drowsy call us  and we will cut your medication dosing down  #Multiple small lung nodules 4 mm on CT scan of the chest June 2025  Plan  - Will order a follow-up CT scan of the chest for June 2026 but we can do this at the next visit  #Followup - I will see you in 8 weeks; RSI cough score at follow-up - any problems call or come sooner     FOLLOWUP    Return for Dr Geronimo in 8 weeks 15 min visit.    SIGNATURE    Dr. Dorethia Geronimo, M.D., F.C.C.P,  Pulmonary and Critical Care Medicine Staff Physician, [facility] Center Director - Interstitial Lung Disease  Program  Pulmonary Fibrosis [facility] at [facility] [place], [place], [address]  Pager: [phone], If no answer or between  15:00h - 7:00h: call [phone] Telephone: [phone]  4:08 PM 07/18/2024

## 2024-07-18 NOTE — Telephone Encounter (Signed)
 Please inform Eric Vazquez That after he left I reviewed the records.  I believe with the history of rash on the cough that he would benefit from seasonal allergy testing blood test.  Plan - I have ordered this blood test but he needs to wait at least another week or 2 and do it.  And otherwise he needs to be a few weeks out from his most recent prednisone  to do it

## 2024-07-18 NOTE — Patient Instructions (Addendum)
 ICD-10-CM   1. Chronic cough  R05.3 Nitric oxide    2. 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 Sinus issues and possible acid reflux issues could be contributing   Plan - #Sinus drainage  -Current new ongoing treatment    #Possible Acid Reflux -Continue ongoing treatment with Pepcid  and Protonix  - Consider stopping turmeric for 1 month  #Possible Asthma  -  - Continue ongoing treatment with Singulair   - Do pulmonary function test in 8 weeks  - Do allergy blood test in 2-4 weeks  #Cyclical cough otherwise call cough neuropathy  - please choose 2-3 days and observe complete voice rest - no talking or whispering  - at all times there  there is urge to cough, drink water or swallow or sip on throat lozenge - Refer to voice rehabilitation with [facility] - Take gabapentin 300mg  once daily x 7 days, then 300mg  twice daily x 7 days, then 300mg  three times daily to continue. If this makes you too sleepy or drowsy call us  and we will cut your medication dosing down  #Multiple small lung nodules 4 mm on CT scan of the chest June 2025  Plan  - Will order a follow-up CT scan of the chest for June 2026 but we can do this at the next visit  #Followup - I will see you in 8 weeks; RSI cough score at follow-up - any problems call or come sooner

## 2024-07-21 NOTE — Telephone Encounter (Signed)
 Called pt and there was no answer-LMTCB

## 2024-07-28 NOTE — Telephone Encounter (Signed)
 I called and spoke with the pt and notified of the below recommendations per Dr. Geronimo. Pt verbalized understanding. He states that he prefers to discuss this at next visit since his cough is improving. Will keep f/u with TP in Dec 2025 and call sooner if needed. Nothing further needed at this time.

## 2024-08-24 ENCOUNTER — Other Ambulatory Visit: Payer: Self-pay | Admitting: Family Medicine

## 2024-09-12 ENCOUNTER — Ambulatory Visit

## 2024-09-12 DIAGNOSIS — R918 Other nonspecific abnormal finding of lung field: Secondary | ICD-10-CM

## 2024-09-12 DIAGNOSIS — R053 Chronic cough: Secondary | ICD-10-CM

## 2024-09-12 LAB — PULMONARY FUNCTION TEST
DL/VA % pred: 104 %
DL/VA: 4.33 ml/min/mmHg/L
DLCO unc % pred: 100 %
DLCO unc: 29.88 ml/min/mmHg
FEF 25-75 Post: 3.44 L/s
FEF 25-75 Pre: 2.99 L/s
FEF2575-%Change-Post: 15 %
FEF2575-%Pred-Post: 108 %
FEF2575-%Pred-Pre: 94 %
FEV1-%Change-Post: 3 %
FEV1-%Pred-Post: 98 %
FEV1-%Pred-Pre: 94 %
FEV1-Post: 3.87 L
FEV1-Pre: 3.73 L
FEV1FVC-%Change-Post: 3 %
FEV1FVC-%Pred-Pre: 101 %
FEV6-%Change-Post: 0 %
FEV6-%Pred-Post: 97 %
FEV6-%Pred-Pre: 96 %
FEV6-Post: 4.84 L
FEV6-Pre: 4.83 L
FEV6FVC-%Change-Post: 0 %
FEV6FVC-%Pred-Post: 104 %
FEV6FVC-%Pred-Pre: 103 %
FVC-%Change-Post: 0 %
FVC-%Pred-Post: 93 %
FVC-%Pred-Pre: 93 %
FVC-Post: 4.89 L
FVC-Pre: 4.87 L
Post FEV1/FVC ratio: 79 %
Post FEV6/FVC ratio: 99 %
Pre FEV1/FVC ratio: 77 %
Pre FEV6/FVC Ratio: 99 %
RV % pred: 91 %
RV: 2.24 L
TLC % pred: 92 %
TLC: 7.06 L

## 2024-09-12 NOTE — Patient Instructions (Signed)
 Full pft performed today

## 2024-09-12 NOTE — Progress Notes (Signed)
 Full pft performed today

## 2024-09-19 ENCOUNTER — Encounter: Payer: Self-pay | Admitting: Adult Health

## 2024-09-19 ENCOUNTER — Ambulatory Visit: Admitting: Adult Health

## 2024-09-19 VITALS — BP 118/79 | HR 55 | Temp 97.7°F | Ht 73.0 in | Wt 201.2 lb

## 2024-09-19 DIAGNOSIS — R053 Chronic cough: Secondary | ICD-10-CM

## 2024-09-19 DIAGNOSIS — J310 Chronic rhinitis: Secondary | ICD-10-CM

## 2024-09-19 DIAGNOSIS — R058 Other specified cough: Secondary | ICD-10-CM | POA: Diagnosis not present

## 2024-09-19 DIAGNOSIS — Z872 Personal history of diseases of the skin and subcutaneous tissue: Secondary | ICD-10-CM

## 2024-09-19 DIAGNOSIS — J329 Chronic sinusitis, unspecified: Secondary | ICD-10-CM | POA: Diagnosis not present

## 2024-09-19 DIAGNOSIS — R918 Other nonspecific abnormal finding of lung field: Secondary | ICD-10-CM

## 2024-09-19 DIAGNOSIS — K219 Gastro-esophageal reflux disease without esophagitis: Secondary | ICD-10-CM

## 2024-09-19 MED ORDER — IPRATROPIUM BROMIDE 0.03 % NA SOLN: 1.0000 | Freq: Four times a day (QID) | NASAL | 5 refills | Status: AC | PRN

## 2024-09-19 NOTE — Patient Instructions (Addendum)
 Continue on Gabapentin  300mg  At bedtime.  Delsym 2 tsp Twice daily  As needed  cough Ipratropium nasal As needed   Pepcid  20mg .at bedtime As needed   CT chest planned for 03/2025  Follow up with Dr. Geronimo in 3 months

## 2024-09-19 NOTE — Progress Notes (Signed)
 @Patient  ID: Eric Vazquez, male    DOB: 04-Nov-1960, 63 y.o.   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 ANA was normal. Lyme antibody normal. Pork IgE and lamb allergy and alpha gal normal.  Eosinophils normal FENO testing July 18, 2024 normal (14ppb) ER-angioedema/urticaria-prescribed Singulair  and hydroxyzine  Chest x-ray May 11, 2024 clear Cardiac CT-lung fields seen multiple scattered pulmonary nodules largest measuring 4 mm could represent small perifissural lymph nodes no airspace consolidation Pulmonary function testing September 12, 2024 normal, FEV1 98%, ratio 79, FVC 93%, DLCO 100%, no significant bronchodilator response Discussed the use of AI scribe software for clinical note transcription with the patient, who gave verbal consent to proceed.  History of Present Illness Eric Vazquez is a 63 year old male with chronic rhinitis and sinus disease who presents with a chronic cough.  He has experienced a chronic cough for years. Worse over last several months after traveling to [place]. Seen in October for pulmonary consult recommended to begin gabapentin .  Patient says that he is now taking gabapentin  300 mg only at bedtime.  Did not want to take during the daytime due to potential for drowsiness.  He works long hours as a education officer, community.  Patient says he is substantially improved his cough has dramatically decreased.  He feels that is working very well.   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 He experiences postnasal drip, which he believes contributes to his cough. He uses ipratropium nasal spray up to four times a day to manage this, finding it effective in reducing daytime symptoms. He has tried Singulair  in the past but did not find it effective and is not currently using it.  He has a history of severe hives and angioedema which began while he was in [place]. The hives were severe enough to cause lip swelling and required emergency treatment with epinephrine  and prednisone . The cause of the hives remains unknown despite extensive testing, and they have since decreased in severity. He keeps medications like Benadryl  and an Epipen  on hand in case of recurrence.  He has a history of reflux/LPR.  He is not currently on any reflux medication but has Pepcid  available at home if needed.   He underwent a chest x-ray in August, which was clear, and a cardiac CT that revealed small scattered pulmonary nodules, the largest measuring four millimeters. Pulmonary function testing was completed last week.  That showed normal lung function     Allergies[1]  Immunization History  Administered Date(s) Administered   Influenza Inj Mdck Quad Pf 06/22/2018   Influenza Split 06/25/2013, 07/02/2014   Influenza,inj,Quad PF,6+ Mos 06/02/2016, 06/15/2019, 07/01/2021, 07/07/2022   Influenza-Unspecified 06/26/2015, 07/09/2017, 06/13/2020, 07/06/2024   PFIZER(Purple Top)SARS-COV-2 Vaccination 10/17/2019, 11/04/2019, 07/17/2020   PNEUMOCOCCAL CONJUGATE-20 11/30/2023   PPD Test 03/26/2015   Td 07/03/2003, 07/02/2013   Tdap 03/26/2015   Zoster Recombinant(Shingrix) 03/18/2021, 05/26/2021    Past Medical History:  Diagnosis Date   Allergy    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 high speed suction over years    Hemorrhoids    Hx of adenomatous polyp of colon 09/25/2019   Hypothyroidism    Pernicious anemia    Thrombocytopenia    Ulnar neuropathy at elbow of right upper extremity    Urticaria     Tobacco History: Tobacco Use History[2] Counseling given: Not Answered   Outpatient Medications Prior to Visit  Medication Sig Dispense Refill   albuterol  (VENTOLIN  HFA) 108 (90 Base) MCG/ACT inhaler Inhale 2 puffs into the lungs every 6 (six) hours as needed for wheezing or shortness of breath. 8 g 0   cholecalciferol (VITAMIN D3) 25 MCG (1000 UT) tablet Take 2,000 Units by mouth daily.     cyanocobalamin  (VITAMIN B12) 1000 MCG/ML injection INJECT 1ML INTO THE MUSCLE EVERY 14 DAYS 25 mL 3   EPINEPHrine  0.3 mg/0.3 mL IJ SOAJ injection Inject 0.3 mg into the muscle as needed for anaphylaxis. 2 each 0   folic acid  (FOLVITE ) 1 MG tablet Take 1 tablet (1 mg total) by mouth daily. 90 tablet 3   gabapentin  (NEURONTIN ) 300 MG capsule 1 daily x 7 days, 1 twice daily x 7 days, then 1 three x daily thereafter 90 capsule 1   ipratropium (ATROVENT ) 0.03 % nasal spray Place 1 spray into both nostrils 4 (four) times daily as needed for rhinitis. 30 mL 5   Nutritional Supplements (LIPOTROPIC COMPLEX PO) Take by mouth.     SYNTHROID  175 MCG tablet TAKE 1 TABLET BY MOUTH EVERY DAY BEFORE BREAKFAST 90 tablet 3   Syringe/Needle, Disp, (SYRINGE 3CC/25GX1) 25G X 1 3 ML MISC INJECT 1 ML INTO THE MUSCLE EVERY 14 DAYS 50 each 0   Syringe/Needle, Disp, (SYRINGE 3CC/25GX1-1/2) 25G X 1-1/2 3 ML MISC Use to administer B12 injection every 14 days or as directed. 100 each 1   tadalafil (CIALIS) 5 MG tablet Take 5 mg by mouth daily as needed for erectile dysfunction.     chlorpheniramine-HYDROcodone (TUSSIONEX) 10-8 MG/5ML Take 5 mLs by mouth every 12 (twelve) hours as needed for cough. (Patient not taking: Reported on 09/19/2024) 115 mL 0   famotidine  (PEPCID ) 20 MG tablet Take 1 tablet (20 mg total) by mouth 2 (two) times  daily. (Patient not taking: Reported on 09/19/2024) 180 tablet 3   hydrOXYzine  (ATARAX ) 25 MG tablet Take 1 tablet (25 mg total) by mouth 3 (three) times daily as needed. (Patient not taking: Reported on 09/19/2024) 30 tablet 5   Loratadine 10 MG CAPS 10 mg daily at 6 (six) AM. (Patient not taking: Reported on 09/19/2024)     montelukast  (SINGULAIR ) 10 MG tablet Take 1 tablet (10 mg total) by mouth at bedtime. (Patient not taking: Reported on 09/19/2024) 90 tablet 1   pantoprazole  (PROTONIX ) 40 MG tablet Take 1 tablet (40 mg total) by mouth daily. (Patient not taking: Reported on 09/19/2024) 30 tablet 0   TURMERIC PO Take by mouth. (Patient not taking: Reported on 09/19/2024)     Vitamin E 670 MG (1000 UT) CAPS Take 1,000 Units by mouth. (Patient not taking: Reported on 09/19/2024)     amoxicillin (AMOXIL) 500 MG capsule Take by mouth as directed. (Patient not taking: Reported on 09/19/2024)     azithromycin  (ZITHROMAX  Z-PAK) 250 MG tablet Take two pills today followed by one a day until  gone (Patient not taking: Reported on 09/19/2024) 6 tablet 0   No facility-administered medications prior to visit.     Review of Systems:   Constitutional:   No  weight loss, night sweats,  Fevers, chills, fatigue, or  lassitude.  HEENT:   No headaches,  Difficulty swallowing,  Tooth/dental problems, or  Sore throat,                No sneezing, itching, ear ache, +nasal congestion, post nasal drip,   CV:  No chest pain,  Orthopnea, PND, swelling in lower extremities, anasarca, dizziness, palpitations, syncope.   GI  No heartburn, indigestion, abdominal pain, nausea, vomiting, diarrhea, change in bowel habits, loss of appetite, bloody stools.   Resp: No shortness of breath with exertion or at rest.  No excess mucus, no productive cough,  No non-productive cough,  No coughing up of blood.  No change in color of mucus.  No wheezing.  No chest wall deformity  Skin: no rash or lesions.  GU: no dysuria,  change in color of urine, no urgency or frequency.  No flank pain, no hematuria   MS:  No joint pain or swelling.  No decreased range of motion.  No back pain.    Physical Exam  BP 118/79   Pulse (!) 55   Temp 97.7 F (36.5 C)   Ht 6' 1 (1.854 m) Comment: Per pt  Wt 201 lb 3.2 oz (91.3 kg)   SpO2 96% Comment: RA  BMI 26.55 kg/m   GEN: A/Ox3; pleasant , NAD, well nourished    HEENT:  [place]/AT,  NOSE-clear, THROAT-clear, no lesions, no postnasal drip or exudate noted.   NECK:  Supple w/ fair ROM; no JVD; normal carotid impulses w/o bruits; no thyromegaly or nodules palpated; no lymphadenopathy.    RESP  Clear  P & A; w/o, wheezes/ rales/ or rhonchi. no accessory muscle use, no dullness to percussion  CARD:  RRR, no m/r/g, no peripheral edema, pulses intact, no cyanosis or clubbing.  GI:   Soft & nt; nml bowel sounds; no organomegaly or masses detected.   Musco: Warm bil, no deformities or joint swelling noted.   Neuro: alert, no focal deficits noted.    Skin: Warm, no lesions or rashes    Lab Results:Reviewed 09/19/2024   CBC    Component Value Date/Time   WBC 10.4 05/07/2024 0813   RBC 5.01 05/07/2024 0813   HGB 15.1 05/07/2024 0813   HGB 16.2 04/17/2024 1146   HGB 14.9 11/13/2014 0000   HCT 44.9 05/07/2024 0813   HCT 49.4 04/17/2024 1146   HCT 46 11/13/2014 0000   PLT 130 (L) 05/07/2024 0813   PLT 153 04/17/2024 1146   MCV 89.6 05/07/2024 0813   MCV 92 04/17/2024 1146   MCV 89.1 11/13/2014 0000   MCH 30.1 05/07/2024 0813   MCHC 33.6 05/07/2024 0813   RDW 12.7 05/07/2024 0813   RDW 12.8 04/17/2024 1146   RDW 12.4 11/13/2014 0000   LYMPHSABS 1.5 05/07/2024 0813   LYMPHSABS 1.3 04/17/2024 1146   MONOABS 1.0 05/07/2024 0813   EOSABS 0.1 05/07/2024 0813   EOSABS 0.1 04/17/2024 1146   BASOSABS 0.0 05/07/2024 0813   BASOSABS 0.0 04/17/2024 1146    BMET    Component Value Date/Time   NA 142 05/07/2024 0813   NA 144 11/13/2014 0000   K 3.8 05/07/2024  0813   K 4.1 11/13/2014 0000   CL 105 05/07/2024 0813   CL 105  11/13/2014 0000   CO2 28 05/07/2024 0813   CO2 29 11/13/2014 0000   GLUCOSE 140 (H) 05/07/2024 0813   BUN 12 05/07/2024 0813   BUN 16 11/13/2014 0000   CREATININE 1.15 05/07/2024 0813   CREATININE 1.18 11/21/2013 0000   CALCIUM 9.4 05/07/2024 0813   CALCIUM 8.9 11/13/2014 0000   GFRNONAA >60 05/07/2024 0813   GFRNONAA 71 11/21/2013 0000   GFRAA 4.2 11/13/2014 0000    BNP No results found for: BNP  ProBNP No results found for: PROBNP  Imaging: No results found.  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 There has been significant improvement with gabapentin , currently at 300 mg nightly, with no significant side effects. Pulmonary function tests are normal, showing no evidence of asthma. 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 Ipratropium nasal spray effectively manages postnasal drip. He is not currently on reflux medications due to previous exacerbation of hives but is advised to restart Pepcid  if the cough flares. Gabapentin  may address neuropathic hypersensitivity contributing to the  chronic cough. Continue gabapentin  300 mg at night and use ipratropium nasal spray as needed. Consider Delsym for cough prevention if necessary and use saline nasal gel at bedtime for sinus relief. A follow-up in 3 months will assess the potential reduction of the gabapentin  dose.  Multiple small pulmonary nodules   Multiple small pulmonary nodules were noted on a cardiac CT, with the largest measuring 4 mm. There is no acute process or consolidation observed. These nodules are likely benign, possibly small lymph nodes along the fissure line, and there is no immediate concern due to their small size and his non-smoking status. A follow-up CT scan is ordered for June 2026 to monitor the pulmonary nodules.  Chronic rhinitis-appears stable.  Continue current maintenance regimen. No flare off of Singulair .  Urticaria and history of angioedema-currently stable Continue follow-up with allergist.  Continue on maintenance regimen.      Madelin Stank, NP 09/19/2024     [1]  Allergies Allergen Reactions   Latex Other (See Comments)    Other   Penicillins   [2]  Social History Tobacco Use  Smoking Status Never  Smokeless Tobacco Never

## 2024-10-17 ENCOUNTER — Other Ambulatory Visit: Payer: Self-pay | Admitting: Family Medicine

## 2024-12-12 ENCOUNTER — Encounter: Payer: 59 | Admitting: Family Medicine

## 2024-12-19 ENCOUNTER — Ambulatory Visit: Admitting: Adult Health

## 2024-12-19 ENCOUNTER — Encounter: Admitting: Family Medicine
